# Patient Record
Sex: Female | Born: 1980 | Race: White | Hispanic: No | Marital: Married | Smoking: Former smoker
Health system: Southern US, Community
[De-identification: ages and names within clinical notes are randomized; demographics above are authoritative.]

## PROBLEM LIST (undated history)

## (undated) DIAGNOSIS — E079 Disorder of thyroid, unspecified: Secondary | ICD-10-CM

## (undated) DIAGNOSIS — J329 Chronic sinusitis, unspecified: Secondary | ICD-10-CM

## (undated) DIAGNOSIS — E039 Hypothyroidism, unspecified: Secondary | ICD-10-CM

## (undated) DIAGNOSIS — N86 Erosion and ectropion of cervix uteri: Secondary | ICD-10-CM

## (undated) DIAGNOSIS — Q6589 Other specified congenital deformities of hip: Secondary | ICD-10-CM

## (undated) DIAGNOSIS — J302 Other seasonal allergic rhinitis: Secondary | ICD-10-CM

## (undated) DIAGNOSIS — M1990 Unspecified osteoarthritis, unspecified site: Secondary | ICD-10-CM

## (undated) DIAGNOSIS — Z8489 Family history of other specified conditions: Secondary | ICD-10-CM

## (undated) DIAGNOSIS — F419 Anxiety disorder, unspecified: Secondary | ICD-10-CM

## (undated) HISTORY — DX: Anxiety disorder, unspecified: F41.9

## (undated) HISTORY — DX: Chronic sinusitis, unspecified: J32.9

## (undated) HISTORY — DX: Erosion and ectropion of cervix uteri: N86

## (undated) HISTORY — DX: Disorder of thyroid, unspecified: E07.9

## (undated) HISTORY — DX: Unspecified osteoarthritis, unspecified site: M19.90

## (undated) HISTORY — DX: Other specified congenital deformities of hip: Q65.89

## (undated) HISTORY — PX: NO PAST SURGERIES: SHX2092

## (undated) HISTORY — PX: WISDOM TOOTH EXTRACTION: SHX21

---

## 2001-10-07 ENCOUNTER — Other Ambulatory Visit: Admission: RE | Admit: 2001-10-07 | Discharge: 2001-10-07 | Payer: Self-pay | Admitting: *Deleted

## 2002-11-22 ENCOUNTER — Other Ambulatory Visit: Admission: RE | Admit: 2002-11-22 | Discharge: 2002-11-22 | Payer: Self-pay | Admitting: *Deleted

## 2008-05-27 ENCOUNTER — Inpatient Hospital Stay (HOSPITAL_COMMUNITY): Admission: AD | Admit: 2008-05-27 | Discharge: 2008-05-29 | Payer: Self-pay | Admitting: Obstetrics

## 2010-04-23 LAB — CBC
HCT: 30.7 % — ABNORMAL LOW (ref 36.0–46.0)
Hemoglobin: 10.8 g/dL — ABNORMAL LOW (ref 12.0–15.0)
Hemoglobin: 12.5 g/dL (ref 12.0–15.0)
MCHC: 35.1 g/dL (ref 30.0–36.0)
MCHC: 34.6 g/dL (ref 30.0–36.0)
MCV: 92.8 fL (ref 78.0–100.0)
RBC: 3.29 MIL/uL — ABNORMAL LOW (ref 3.87–5.11)
RBC: 3.88 MIL/uL (ref 3.87–5.11)
RDW: 12.6 % (ref 11.5–15.5)

## 2011-09-05 ENCOUNTER — Other Ambulatory Visit: Payer: Self-pay | Admitting: Internal Medicine

## 2011-09-05 ENCOUNTER — Ambulatory Visit
Admission: RE | Admit: 2011-09-05 | Discharge: 2011-09-05 | Disposition: A | Discharge status: Home / Self Care | Payer: 59 | Source: Ambulatory Visit | Attending: Internal Medicine | Admitting: Internal Medicine

## 2011-09-05 DIAGNOSIS — R1013 Epigastric pain: Secondary | ICD-10-CM

## 2011-09-19 ENCOUNTER — Encounter (INDEPENDENT_AMBULATORY_CARE_PROVIDER_SITE_OTHER): Payer: Self-pay

## 2011-09-30 ENCOUNTER — Encounter (INDEPENDENT_AMBULATORY_CARE_PROVIDER_SITE_OTHER): Payer: Self-pay

## 2011-10-07 ENCOUNTER — Ambulatory Visit (INDEPENDENT_AMBULATORY_CARE_PROVIDER_SITE_OTHER): Payer: Self-pay | Admitting: General Surgery

## 2011-10-09 ENCOUNTER — Encounter (INDEPENDENT_AMBULATORY_CARE_PROVIDER_SITE_OTHER): Payer: Self-pay | Admitting: General Surgery

## 2011-10-09 ENCOUNTER — Ambulatory Visit (INDEPENDENT_AMBULATORY_CARE_PROVIDER_SITE_OTHER): Payer: 59 | Admitting: General Surgery

## 2011-10-09 VITALS — BP 100/60 | HR 84 | Temp 97.2°F | Resp 18 | Ht 62.0 in | Wt 116.4 lb

## 2011-10-09 DIAGNOSIS — K8020 Calculus of gallbladder without cholecystitis without obstruction: Secondary | ICD-10-CM

## 2011-10-09 NOTE — Progress Notes (Signed)
Subjective:     Patient ID: Stacy Williams, female   DOB: January 30, 1980, 31 y.o.   MRN: [id number]  HPI We're asked to see the patient in consultation by Dr. Renne Crigler to evaluate her for gallstones. The patient is a 31 year old white female who has been having episodes of epigastric pain and pressure shortly after eating for the last 10 years. For the most part she has had these episodes about once every month or 2. The episodes will last about 6 hours and then resolved. She denies any nausea or vomiting. The pain usually does radiate into the back. Recently she has been experiencing episodes of pain more frequently. As part of her workup she underwent an ultrasound which did show some small stones in the gallbladder with mild gallbladder wall thickening but no ductal dilatation. She also notes having diarrhea several times a week but this is her normal bowel function.  Review of Systems  Constitutional: Negative.   HENT: Negative.   Eyes: Negative.   Respiratory: Negative.   Cardiovascular: Negative.   Gastrointestinal: Positive for abdominal pain and diarrhea.  Genitourinary: Negative.   Musculoskeletal: Negative.   Skin: Negative.   Neurological: Negative.   Hematological: Negative.   Psychiatric/Behavioral: Negative.        Objective:   Physical Exam  Constitutional: She is oriented to person, place, and time. She appears well-developed and well-nourished.  HENT:  Head: Normocephalic and atraumatic.  Eyes: Conjunctivae normal and EOM are normal. Pupils are equal, round, and reactive to light.  Neck: Normal range of motion. Neck supple.  Cardiovascular: Normal rate, regular rhythm and normal heart sounds.   Pulmonary/Chest: Effort normal and breath sounds normal.  Abdominal: Soft. Bowel sounds are normal. She exhibits no mass. There is no tenderness.  Musculoskeletal: Normal range of motion.  Neurological: She is alert and oriented to person, place, and time.  Skin: Skin is warm and dry.    Psychiatric: She has a normal mood and affect. Her behavior is normal.       Assessment:     The patient has what appears to be symptomatic gallstones. Because of the risk of further painful episodes and possible pancreatitis I think she would benefit from having her gallbladder removed. She would also like to have this done. I have discussed with her in detail the risks and benefits of the operation to remove the gallbladder as well as some of the technical aspects and she understands and wishes to proceed.    Plan:     Plan for laparoscopic cholecystectomy with intraoperative cholangiogram

## 2011-10-09 NOTE — Patient Instructions (Signed)
Plan for lap chole with cholangiogram

## 2011-10-22 ENCOUNTER — Encounter (HOSPITAL_COMMUNITY): Payer: Self-pay | Admitting: Pharmacy Technician

## 2011-10-23 ENCOUNTER — Encounter (HOSPITAL_COMMUNITY)
Admission: RE | Admit: 2011-10-23 | Discharge: 2011-10-23 | Disposition: A | Discharge status: Home / Self Care | Payer: 59 | Source: Ambulatory Visit | Attending: General Surgery | Admitting: General Surgery

## 2011-10-23 ENCOUNTER — Encounter (HOSPITAL_COMMUNITY): Payer: Self-pay

## 2011-10-23 HISTORY — DX: Other seasonal allergic rhinitis: J30.2

## 2011-10-23 HISTORY — DX: Family history of other specified conditions: Z84.89

## 2011-10-23 HISTORY — DX: Hypothyroidism, unspecified: E03.9

## 2011-10-23 LAB — CBC WITH DIFFERENTIAL/PLATELET
Basophils Absolute: 0 10*3/uL (ref 0.0–0.1)
Basophils Relative: 0 % (ref 0–1)
Eosinophils Absolute: 0.1 10*3/uL (ref 0.0–0.7)
HCT: 40.6 % (ref 36.0–46.0)
Hemoglobin: 13.9 g/dL (ref 12.0–15.0)
MCH: 30.5 pg (ref 26.0–34.0)
MCHC: 34.2 g/dL (ref 30.0–36.0)
Monocytes Absolute: 0.6 10*3/uL (ref 0.1–1.0)
Monocytes Relative: 6 % (ref 3–12)
RDW: 12.7 % (ref 11.5–15.5)

## 2011-10-23 LAB — COMPREHENSIVE METABOLIC PANEL
Albumin: 4.5 g/dL (ref 3.5–5.2)
BUN: 12 mg/dL (ref 6–23)
Calcium: 9.9 mg/dL (ref 8.4–10.5)
Creatinine, Ser: 0.63 mg/dL (ref 0.50–1.10)
GFR calc Af Amer: >90 mL/min (ref >90)
GFR calc non Af Amer: >90 mL/min (ref >90)
Total Bilirubin: 0.7 mg/dL (ref 0.3–1.2)
Total Protein: 7.7 g/dL (ref 6.0–8.3)

## 2011-10-23 LAB — HCG, SERUM, QUALITATIVE: Preg, Serum: NEGATIVE

## 2011-10-23 NOTE — Pre-Procedure Instructions (Signed)
20 Stacy Williams  10/23/2011   Your procedure is scheduled on:  Thursday October 17  Report to [facility] at 9:00 AM.  Call this number if you have problems the morning of surgery: [phone]   Remember:   Do not eat or drink:After Midnight.    Take these medicines the morning of surgery with A SIP OF WATER: Levothyroxine (Synthroid), Buspirone (Buspar)   Do not wear jewelry, make-up or nail polish.  Do not wear lotions, powders, or perfumes. You may wear deodorant.  Do not shave 48 hours prior to surgery. Men may shave face and neck.  Do not bring valuables to the hospital.  Contacts, dentures or bridgework may not be worn into surgery.  Leave suitcase in the car. After surgery it may be brought to your room.  For patients admitted to the hospital, checkout time is 11:00 AM the day of discharge.   Patients discharged the day of surgery will not be allowed to drive home.  Name and phone number of your driver: NA  Special Instructions: Shower using CHG 2 nights before surgery and the night before surgery.  If you shower the day of surgery use CHG.  Use special wash - you have one bottle of CHG for all showers.  You should use approximately 1/3 of the bottle for each shower.   Please read over the following fact sheets that you were given: Pain Booklet, Coughing and Deep Breathing and Surgical Site Infection Prevention

## 2011-10-29 MED ORDER — CHLORHEXIDINE GLUCONATE 4 % EX LIQD: 1.0000 "application " | Freq: Once | CUTANEOUS | Status: DC

## 2011-10-29 MED ORDER — CEFAZOLIN SODIUM-DEXTROSE 2-3 GM-% IV SOLR
2.0000 g | INTRAVENOUS | Status: AC
  Administered 2011-10-30: 2 g via INTRAVENOUS
  Filled 2011-10-29: qty 50

## 2011-10-30 ENCOUNTER — Encounter (HOSPITAL_COMMUNITY)
Admission: RE | Disposition: A | Discharge status: Home / Self Care | Payer: Self-pay | Source: Ambulatory Visit | Attending: General Surgery

## 2011-10-30 ENCOUNTER — Ambulatory Visit (HOSPITAL_COMMUNITY): Payer: 59 | Admitting: Anesthesiology

## 2011-10-30 ENCOUNTER — Encounter (HOSPITAL_COMMUNITY): Payer: Self-pay | Admitting: *Deleted

## 2011-10-30 ENCOUNTER — Encounter (HOSPITAL_COMMUNITY): Payer: Self-pay | Admitting: Anesthesiology

## 2011-10-30 ENCOUNTER — Ambulatory Visit (HOSPITAL_COMMUNITY): Payer: 59

## 2011-10-30 ENCOUNTER — Ambulatory Visit (HOSPITAL_COMMUNITY)
Admission: RE | Admit: 2011-10-30 | Discharge: 2011-10-30 | Disposition: A | Discharge status: Home / Self Care | Payer: 59 | Source: Ambulatory Visit | Attending: General Surgery | Admitting: General Surgery

## 2011-10-30 DIAGNOSIS — E039 Hypothyroidism, unspecified: Secondary | ICD-10-CM | POA: Insufficient documentation

## 2011-10-30 DIAGNOSIS — K8010 Calculus of gallbladder with chronic cholecystitis without obstruction: Secondary | ICD-10-CM | POA: Insufficient documentation

## 2011-10-30 DIAGNOSIS — Z01812 Encounter for preprocedural laboratory examination: Secondary | ICD-10-CM | POA: Insufficient documentation

## 2011-10-30 HISTORY — PX: CHOLECYSTECTOMY: SHX55

## 2011-10-30 SURGERY — LAPAROSCOPIC CHOLECYSTECTOMY WITH INTRAOPERATIVE CHOLANGIOGRAM
Anesthesia: General | Site: Abdomen | Wound class: Clean Contaminated

## 2011-10-30 MED ORDER — NEOSTIGMINE METHYLSULFATE 1 MG/ML IJ SOLN
INTRAMUSCULAR | Status: DC | PRN
  Administered 2011-10-30: 3 mg via INTRAVENOUS

## 2011-10-30 MED ORDER — SODIUM CHLORIDE 0.9 % IJ SOLN: 3.0000 mL | Freq: Two times a day (BID) | INTRAMUSCULAR | Status: DC

## 2011-10-30 MED ORDER — KCL IN DEXTROSE-NACL 20-5-0.9 MEQ/L-%-% IV SOLN: INTRAVENOUS | Status: DC

## 2011-10-30 MED ORDER — ONDANSETRON HCL 4 MG/2ML IJ SOLN: 4.0000 mg | Freq: Four times a day (QID) | INTRAMUSCULAR | Status: DC | PRN

## 2011-10-30 MED ORDER — MORPHINE SULFATE 4 MG/ML IJ SOLN
4.0000 mg | INTRAMUSCULAR | Status: DC | PRN
  Administered 2011-10-30: 2 mg via INTRAVENOUS

## 2011-10-30 MED ORDER — FENTANYL CITRATE 0.05 MG/ML IJ SOLN: 50.0000 ug | Freq: Once | INTRAMUSCULAR | Status: DC

## 2011-10-30 MED ORDER — BUSPIRONE HCL 5 MG PO TABS: 5.0000 mg | ORAL_TABLET | Freq: Every day | ORAL | Status: DC

## 2011-10-30 MED ORDER — HYDROCODONE-ACETAMINOPHEN 5-325 MG PO TABS: 1.0000 | ORAL_TABLET | Freq: Four times a day (QID) | ORAL | Status: DC | PRN

## 2011-10-30 MED ORDER — ARTIFICIAL TEARS OP OINT
TOPICAL_OINTMENT | OPHTHALMIC | Status: DC | PRN
  Administered 2011-10-30: 1 via OPHTHALMIC

## 2011-10-30 MED ORDER — LACTATED RINGERS IV SOLN
INTRAVENOUS | Status: DC
  Administered 2011-10-30: 10:00:00 via INTRAVENOUS

## 2011-10-30 MED ORDER — SODIUM CHLORIDE 0.9 % IJ SOLN: 3.0000 mL | INTRAMUSCULAR | Status: DC | PRN

## 2011-10-30 MED ORDER — FENTANYL CITRATE 0.05 MG/ML IJ SOLN
INTRAMUSCULAR | Status: DC | PRN
  Administered 2011-10-30: 50 ug via INTRAVENOUS
  Administered 2011-10-30: 100 ug via INTRAVENOUS

## 2011-10-30 MED ORDER — 0.9 % SODIUM CHLORIDE (POUR BTL) OPTIME
TOPICAL | Status: DC | PRN
  Administered 2011-10-30: 1000 mL

## 2011-10-30 MED ORDER — LORATADINE 10 MG PO TABS: 10.0000 mg | ORAL_TABLET | Freq: Every day | ORAL | Status: DC

## 2011-10-30 MED ORDER — SODIUM CHLORIDE 0.9 % IV SOLN
INTRAVENOUS | Status: DC | PRN
  Administered 2011-10-30: 11:00:00

## 2011-10-30 MED ORDER — ONDANSETRON HCL 4 MG/2ML IJ SOLN
INTRAMUSCULAR | Status: DC | PRN
  Administered 2011-10-30: 4 mg via INTRAVENOUS

## 2011-10-30 MED ORDER — BUPIVACAINE-EPINEPHRINE 0.25% -1:200000 IJ SOLN
INTRAMUSCULAR | Status: DC | PRN
  Administered 2011-10-30: 5 mL

## 2011-10-30 MED ORDER — SODIUM CHLORIDE 0.9 % IV SOLN: 250.0000 mL | INTRAVENOUS | Status: DC | PRN

## 2011-10-30 MED ORDER — ACETAMINOPHEN 650 MG RE SUPP: 650.0000 mg | RECTAL | Status: DC | PRN

## 2011-10-30 MED ORDER — TRAMADOL HCL 50 MG PO TABS: 50.0000 mg | ORAL_TABLET | Freq: Four times a day (QID) | ORAL | Status: DC | PRN

## 2011-10-30 MED ORDER — DEXAMETHASONE SODIUM PHOSPHATE 4 MG/ML IJ SOLN
INTRAMUSCULAR | Status: DC | PRN
  Administered 2011-10-30: 10 mg via INTRAVENOUS

## 2011-10-30 MED ORDER — GLYCOPYRROLATE 0.2 MG/ML IJ SOLN
INTRAMUSCULAR | Status: DC | PRN
  Administered 2011-10-30: 0.4 mg via INTRAVENOUS

## 2011-10-30 MED ORDER — MIDAZOLAM HCL 5 MG/5ML IJ SOLN
INTRAMUSCULAR | Status: DC | PRN
  Administered 2011-10-30: 2 mg via INTRAVENOUS

## 2011-10-30 MED ORDER — EPHEDRINE SULFATE 50 MG/ML IJ SOLN
INTRAMUSCULAR | Status: DC | PRN
  Administered 2011-10-30 (×2): 10 mg via INTRAVENOUS

## 2011-10-30 MED ORDER — ACETAMINOPHEN 10 MG/ML IV SOLN
INTRAVENOUS | Status: AC
  Filled 2011-10-30: qty 100

## 2011-10-30 MED ORDER — MORPHINE SULFATE 4 MG/ML IJ SOLN
INTRAMUSCULAR | Status: AC
  Filled 2011-10-30: qty 1

## 2011-10-30 MED ORDER — SODIUM CHLORIDE 0.9 % IR SOLN
Status: DC | PRN
  Administered 2011-10-30: 1000 mL

## 2011-10-30 MED ORDER — BUPIVACAINE-EPINEPHRINE PF 0.25-1:200000 % IJ SOLN
INTRAMUSCULAR | Status: AC
  Filled 2011-10-30: qty 30

## 2011-10-30 MED ORDER — OXYCODONE HCL 5 MG PO TABS: 5.0000 mg | ORAL_TABLET | ORAL | Status: DC | PRN

## 2011-10-30 MED ORDER — LEVOTHYROXINE SODIUM 88 MCG PO TABS: 88.0000 ug | ORAL_TABLET | Freq: Every day | ORAL | Status: DC

## 2011-10-30 MED ORDER — ACETAMINOPHEN 325 MG PO TABS: 650.0000 mg | ORAL_TABLET | ORAL | Status: DC | PRN

## 2011-10-30 MED ORDER — ROCURONIUM BROMIDE 100 MG/10ML IV SOLN
INTRAVENOUS | Status: DC | PRN
  Administered 2011-10-30: 40 mg via INTRAVENOUS

## 2011-10-30 MED ORDER — PRENATAL 27-0.8 MG PO TABS: 1.0000 | ORAL_TABLET | Freq: Every day | ORAL | Status: DC

## 2011-10-30 MED ORDER — MIDAZOLAM HCL 2 MG/2ML IJ SOLN: 1.0000 mg | INTRAMUSCULAR | Status: DC | PRN

## 2011-10-30 MED ORDER — LIDOCAINE HCL (CARDIAC) 20 MG/ML IV SOLN
INTRAVENOUS | Status: DC | PRN
  Administered 2011-10-30: 60 mg via INTRAVENOUS

## 2011-10-30 MED ORDER — PROPOFOL 10 MG/ML IV BOLUS
INTRAVENOUS | Status: DC | PRN
  Administered 2011-10-30: 150 mg via INTRAVENOUS

## 2011-10-30 MED ORDER — ACETAMINOPHEN 10 MG/ML IV SOLN
1000.0000 mg | Freq: Once | INTRAVENOUS | Status: AC
  Administered 2011-10-30: 1000 mg via INTRAVENOUS
  Filled 2011-10-30: qty 100

## 2011-10-30 SURGICAL SUPPLY — 43 items
APPLIER CLIP 5 13 M/L LIGAMAX5 (MISCELLANEOUS) ×2
APPLIER CLIP ROT 10 11.4 M/L (STAPLE) ×2
BLADE SURG ROTATE 9660 (MISCELLANEOUS) IMPLANT
CANISTER SUCTION 2500CC (MISCELLANEOUS) ×2 IMPLANT
CATH REDDICK CHOLANGI 4FR 50CM (CATHETERS) ×2 IMPLANT
CHLORAPREP W/TINT 26ML (MISCELLANEOUS) ×2 IMPLANT
CLIP APPLIE 5 13 M/L LIGAMAX5 (MISCELLANEOUS) ×1 IMPLANT
CLIP APPLIE ROT 10 11.4 M/L (STAPLE) ×1 IMPLANT
CLOTH BEACON ORANGE TIMEOUT ST (SAFETY) ×2 IMPLANT
COVER MAYO STAND STRL (DRAPES) ×2 IMPLANT
COVER SURGICAL LIGHT HANDLE (MISCELLANEOUS) ×2 IMPLANT
DECANTER SPIKE VIAL GLASS SM (MISCELLANEOUS) ×2 IMPLANT
DERMABOND ADVANCED (GAUZE/BANDAGES/DRESSINGS) ×1
DERMABOND ADVANCED .7 DNX12 (GAUZE/BANDAGES/DRESSINGS) ×1 IMPLANT
DRAPE C-ARM 42X72 X-RAY (DRAPES) ×2 IMPLANT
DRAPE UTILITY 15X26 W/TAPE STR (DRAPE) ×4 IMPLANT
ELECT REM PT RETURN 9FT ADLT (ELECTROSURGICAL) ×2
ELECTRODE REM PT RTRN 9FT ADLT (ELECTROSURGICAL) ×1 IMPLANT
GLOVE BIO SURGEON STRL SZ7.5 (GLOVE) ×4 IMPLANT
GLOVE BIOGEL PI IND STRL 7.0 (GLOVE) ×1 IMPLANT
GLOVE BIOGEL PI IND STRL 7.5 (GLOVE) ×2 IMPLANT
GLOVE BIOGEL PI INDICATOR 7.0 (GLOVE) ×1
GLOVE BIOGEL PI INDICATOR 7.5 (GLOVE) ×2
GLOVE EUDERMIC 7 POWDERFREE (GLOVE) ×2 IMPLANT
GLOVE SURG SS PI 7.0 STRL IVOR (GLOVE) ×2 IMPLANT
GOWN STRL NON-REIN LRG LVL3 (GOWN DISPOSABLE) ×6 IMPLANT
IV CATH 14GX2 1/4 (CATHETERS) ×2 IMPLANT
KIT BASIN OR (CUSTOM PROCEDURE TRAY) ×2 IMPLANT
KIT ROOM TURNOVER OR (KITS) ×2 IMPLANT
NS IRRIG 1000ML POUR BTL (IV SOLUTION) ×2 IMPLANT
PAD ARMBOARD 7.5X6 YLW CONV (MISCELLANEOUS) ×2 IMPLANT
POUCH SPECIMEN RETRIEVAL 10MM (ENDOMECHANICALS) ×2 IMPLANT
SCISSORS LAP 5X35 DISP (ENDOMECHANICALS) IMPLANT
SET IRRIG TUBING LAPAROSCOPIC (IRRIGATION / IRRIGATOR) ×2 IMPLANT
SLEEVE ENDOPATH XCEL 5M (ENDOMECHANICALS) ×4 IMPLANT
SPECIMEN JAR SMALL (MISCELLANEOUS) ×2 IMPLANT
SUT MNCRL AB 4-0 PS2 18 (SUTURE) ×2 IMPLANT
TOWEL OR 17X24 6PK STRL BLUE (TOWEL DISPOSABLE) ×2 IMPLANT
TOWEL OR 17X26 10 PK STRL BLUE (TOWEL DISPOSABLE) ×2 IMPLANT
TRAY LAPAROSCOPIC (CUSTOM PROCEDURE TRAY) ×2 IMPLANT
TROCAR XCEL BLUNT TIP 100MML (ENDOMECHANICALS) ×2 IMPLANT
TROCAR XCEL NON-BLD 11X100MML (ENDOMECHANICALS) ×2 IMPLANT
TROCAR XCEL NON-BLD 5MMX100MML (ENDOMECHANICALS) ×2 IMPLANT

## 2011-10-30 NOTE — Anesthesia Preprocedure Evaluation (Addendum)
Anesthesia Evaluation  Patient identified by MRN, date of birth, ID band Patient awake    Reviewed: Allergy & Precautions, H&P , NPO status , Patient's Chart, lab work & pertinent test results  Airway Mallampati: I TM Distance: >3 FB Neck ROM: Full    Dental  (+) Teeth Intact and Dental Advisory Given   Pulmonary former smoker,  sinusitis breath sounds clear to auscultation        Cardiovascular Rhythm:Regular Rate:Normal     Neuro/Psych Anxiety    GI/Hepatic   Endo/Other  Hypothyroidism   Renal/GU      Musculoskeletal   Abdominal   Peds  Hematology   Anesthesia Other Findings   Reproductive/Obstetrics                          Anesthesia Physical Anesthesia Plan  ASA: II  Anesthesia Plan: General   Post-op Pain Management:    Induction: Intravenous  Airway Management Planned: Oral ETT  Additional Equipment:   Intra-op Plan:   Post-operative Plan: Extubation in OR  Informed Consent: I have reviewed the patients History and Physical, chart, labs and discussed the procedure including the risks, benefits and alternatives for the proposed anesthesia with the patient or authorized representative who has indicated his/her understanding and acceptance.     Plan Discussed with: CRNA and Surgeon  Anesthesia Plan Comments:         Anesthesia Quick Evaluation

## 2011-10-30 NOTE — Anesthesia Postprocedure Evaluation (Signed)
  Anesthesia Post-op Note  Patient: Stacy Williams  Procedure(s) Performed: Procedure(s) (LRB) with comments: LAPAROSCOPIC CHOLECYSTECTOMY WITH INTRAOPERATIVE CHOLANGIOGRAM (N/A)  Patient Location: PACU  Anesthesia Type: General  Level of Consciousness: awake  Airway and Oxygen Therapy: Patient Spontanous Breathing  Post-op Pain: mild  Post-op Assessment: Post-op Vital signs reviewed, Patient's Cardiovascular Status Stable, Respiratory Function Stable, Patent Airway, No signs of Nausea or vomiting and Pain level controlled  Post-op Vital Signs: stable  Complications: No apparent anesthesia complications

## 2011-10-30 NOTE — Interval H&P Note (Signed)
History and Physical Interval Note:  10/30/2011 9:24 AM  Stacy Williams  has presented today for surgery, with the diagnosis of gallstones  The various methods of treatment have been discussed with the patient and family. After consideration of risks, benefits and other options for treatment, the patient has consented to  Procedure(s) (LRB) with comments: LAPAROSCOPIC CHOLECYSTECTOMY WITH INTRAOPERATIVE CHOLANGIOGRAM (N/A) as a surgical intervention .  The patient's history has been reviewed, patient examined, no change in status, stable for surgery.  I have reviewed the patient's chart and labs.  Questions were answered to the patient's satisfaction.     TOTH III,Yanni Ruberg S

## 2011-10-30 NOTE — Anesthesia Procedure Notes (Signed)
Procedure Name: Intubation Date/Time: 10/23/2011 10:15 AM Performed by: Jefm Miles E Pre-anesthesia Checklist: Patient identified, Timeout performed, Emergency Drugs available, Suction available and Patient being monitored Patient Re-evaluated:Patient Re-evaluated prior to inductionOxygen Delivery Method: Circle system utilized Preoxygenation: Pre-oxygenation with 100% oxygen Intubation Type: IV induction Ventilation: Mask ventilation without difficulty Laryngoscope Size: Mac and 3 Grade View: Grade I Tube type: Oral Tube size: 7.0 mm Number of attempts: 1 Airway Equipment and Method: Stylet Placement Confirmation: ETT inserted through vocal cords under direct vision,  breath sounds checked- equal and bilateral and positive ETCO2 Secured at: 21 cm Tube secured with: Tape Dental Injury: Teeth and Oropharynx as per pre-operative assessment

## 2011-10-30 NOTE — Transfer of Care (Signed)
Immediate Anesthesia Transfer of Care Note  Patient: Stacy Williams  Procedure(s) Performed: Procedure(s) (LRB) with comments: LAPAROSCOPIC CHOLECYSTECTOMY WITH INTRAOPERATIVE CHOLANGIOGRAM (N/A)  Patient Location: PACU  Anesthesia Type: General  Level of Consciousness: awake, alert  and oriented  Airway & Oxygen Therapy: Patient Spontanous Breathing and Patient connected to nasal cannula oxygen  Post-op Assessment: Report given to PACU RN  Post vital signs: Reviewed and stable  Complications: No apparent anesthesia complications

## 2011-10-30 NOTE — H&P (View-Only) (Signed)
Subjective:     Patient ID: Stacy Williams, female   DOB: 06/23/1980, 31 y.o.   MRN: [id number]  HPI We're asked to see the patient in consultation by Dr. Pharr to evaluate her for gallstones. The patient is a 31-year-old white female who has been having episodes of epigastric pain and pressure shortly after eating for the last 10 years. For the most part she has had these episodes about once every month or 2. The episodes will last about 6 hours and then resolved. She denies any nausea or vomiting. The pain usually does radiate into the back. Recently she has been experiencing episodes of pain more frequently. As part of her workup she underwent an ultrasound which did show some small stones in the gallbladder with mild gallbladder wall thickening but no ductal dilatation. She also notes having diarrhea several times a week but this is her normal bowel function.  Review of Systems  Constitutional: Negative.   HENT: Negative.   Eyes: Negative.   Respiratory: Negative.   Cardiovascular: Negative.   Gastrointestinal: Positive for abdominal pain and diarrhea.  Genitourinary: Negative.   Musculoskeletal: Negative.   Skin: Negative.   Neurological: Negative.   Hematological: Negative.   Psychiatric/Behavioral: Negative.        Objective:   Physical Exam  Constitutional: She is oriented to person, place, and time. She appears well-developed and well-nourished.  HENT:  Head: Normocephalic and atraumatic.  Eyes: Conjunctivae normal and EOM are normal. Pupils are equal, round, and reactive to light.  Neck: Normal range of motion. Neck supple.  Cardiovascular: Normal rate, regular rhythm and normal heart sounds.   Pulmonary/Chest: Effort normal and breath sounds normal.  Abdominal: Soft. Bowel sounds are normal. She exhibits no mass. There is no tenderness.  Musculoskeletal: Normal range of motion.  Neurological: She is alert and oriented to person, place, and time.  Skin: Skin is warm and dry.    Psychiatric: She has a normal mood and affect. Her behavior is normal.       Assessment:     The patient has what appears to be symptomatic gallstones. Because of the risk of further painful episodes and possible pancreatitis I think she would benefit from having her gallbladder removed. She would also like to have this done. I have discussed with her in detail the risks and benefits of the operation to remove the gallbladder as well as some of the technical aspects and she understands and wishes to proceed.    Plan:     Plan for laparoscopic cholecystectomy with intraoperative cholangiogram      

## 2011-10-30 NOTE — Preoperative (Signed)
Beta Blockers   Reason not to administer Beta Blockers:Not Applicable 

## 2011-10-30 NOTE — Progress Notes (Signed)
Pt d/c'd home via wheelchair, parents at bedside

## 2011-10-30 NOTE — Op Note (Signed)
10/30/2011  11:09 AM  PATIENT:  Stacy Williams  31 y.o. female  PRE-OPERATIVE DIAGNOSIS:  CHOLELITHIASIS  POST-OPERATIVE DIAGNOSIS:  CHOLELITHIASIS  PROCEDURE:  Procedure(s) (LRB) with comments: LAPAROSCOPIC CHOLECYSTECTOMY WITH INTRAOPERATIVE CHOLANGIOGRAM (N/A)  SURGEON:  Surgeon(s) and Role:    * Robyne Askew, MD - Primary    * Christian Leta Jungling, MD - Assisting  PHYSICIAN ASSISTANT:   ASSISTANTS: Dr. Jamey Ripa   ANESTHESIA:   general  EBL:     BLOOD ADMINISTERED:none  DRAINS: none   LOCAL MEDICATIONS USED:  MARCAINE     SPECIMEN:  Source of Specimen:  gallbladder  DISPOSITION OF SPECIMEN:  PATHOLOGY  COUNTS:  YES  TOURNIQUET:  * No tourniquets in log *  DICTATION: .Dragon Dictation  Procedure: After informed consent was obtained the patient was brought to the operating room and placed in the supine position on the operating room table. After adequate induction of general anesthesia the patient's abdomen was prepped with ChloraPrep allowed to dry and draped in usual sterile manner. The area below the umbilicus was infiltrated with quarter percent  Marcaine. A small incision was made with a 15 blade knife. The incision was carried down through the subcutaneous tissue bluntly with a hemostat and [employer] retractors. The linea alba was identified. The linea alba was incised with a 15 blade knife and each side was grasped with Coker clamps. The preperitoneal space was then probed with a hemostat until the peritoneum was opened and access was gained to the abdominal cavity. A 0 Vicryl pursestring stitch was placed in the fascia surrounding the opening. A Hassan cannula was then placed through the opening and anchored in place with the previously placed Vicryl purse string stitch. The abdomen was insufflated with carbon dioxide without difficulty. A laparoscope was inserted through the [facility] cannula in the right upper quadrant was inspected. Next the epigastric region was  infiltrated with % Marcaine. A small incision was made with a 15 blade knife. A 5 mm port was placed bluntly through this incision into the abdominal cavity under direct vision. Next 2 sites were chosen laterally on the right side of the abdomen for placement of 5 mm ports. Each of these areas was infiltrated with quarter percent Marcaine. Small stab incisions were made with a 15 blade knife. 5 mm ports were then placed bluntly through these incisions into the abdominal cavity under direct vision without difficulty. A blunt grasper was placed through the lateralmost 5 mm port and used to grasp the dome of the gallbladder and elevated anteriorly and superiorly. Another blunt grasper was placed through the other 5 mm port and used to retract the body and neck of the gallbladder. A dissector was placed through the epigastric port and using the electrocautery the peritoneal reflection at the gallbladder neck was opened. Blunt dissection was then carried out in this area until the gallbladder neck-cystic duct junction was readily identified and a good window was created. A single clip was placed on the gallbladder neck. A small  ductotomy was made just below the clip with laparoscopic scissors. A 14-gauge Angiocath was then placed through the anterior abdominal wall under direct vision. A Reddick cholangiogram catheter was then placed through the Angiocath and flushed. The catheter was then placed in the cystic duct and anchored in place with a clip. A cholangiogram was obtained that showed no filling defects good emptying into the duodenum an adequate length on the cystic duct. The anchoring clip and catheters were then removed from the  patient. 3 clips were placed proximally on the cystic duct and the duct was divided between the 2 sets of clips. Posterior to this the cystic artery was identified and again dissected bluntly in a circumferential manner until a good window  was created. 2 clips were placed proximally  and one distally on the artery and the artery was divided between the 2 sets of clips. Next a laparoscopic hook cautery device was used to separate the gallbladder from the liver bed. Prior to completely detaching the gallbladder from the liver bed the liver bed was inspected and several small bleeding points were coagulated with the electrocautery until the area was completely hemostatic. The gallbladder was then detached the rest of it from the liver bed without difficulty. A laparoscopic bag was inserted through the [facility] canula. The gallbladder was placed within the bag and the bag was sealed. The laparoscope was moved to the epigastric port. The gallbladder was removed through the [facility] cannula without difficulty. The fascial defect was then closed with the previously placed Vicryl pursestring stitch as well as with another figure-of-eight 0 Vicryl stitch. The liver bed was inspected again and found to be hemostatic. The abdomen was irrigated with copious amounts of saline until the effluent was clear. The ports were then removed under direct vision without difficulty and were found to be hemostatic. The gas was allowed to escape. The skin incisions were all closed with interrupted 4-0 Monocryl subcuticular stitches. Dermabond dressings were applied. The patient tolerated the procedure well. At the end of the case all needle sponge and instrument counts were correct. The patient was then awakened and taken to recovery in stable condition   PLAN OF CARE: Discharge to home after PACU  PATIENT DISPOSITION:  PACU - hemodynamically stable.   Delay start of Pharmacological VTE agent (>24hrs) due to surgical blood loss or risk of bleeding: not applicable

## 2011-10-31 ENCOUNTER — Encounter (HOSPITAL_COMMUNITY): Payer: Self-pay | Admitting: General Surgery

## 2011-10-31 ENCOUNTER — Telehealth (INDEPENDENT_AMBULATORY_CARE_PROVIDER_SITE_OTHER): Payer: Self-pay

## 2011-10-31 NOTE — Telephone Encounter (Signed)
Patient called to ask if she could use something to treat her surgical scars to reduce their size and when she can start using them.  I told her to wait about 3 to 4 weeks postop.  If she is healed she can start using Mederma, Cocoa butter cream or vitamin e cream.  She is coming in for a follow up 3 weeks postop.  I told her Dr Carolynne Edouard will check her incisions and let her know when to start.

## 2011-11-14 ENCOUNTER — Encounter (INDEPENDENT_AMBULATORY_CARE_PROVIDER_SITE_OTHER): Payer: Self-pay | Admitting: General Surgery

## 2011-11-14 ENCOUNTER — Ambulatory Visit (INDEPENDENT_AMBULATORY_CARE_PROVIDER_SITE_OTHER): Payer: 59 | Admitting: General Surgery

## 2011-11-14 ENCOUNTER — Other Ambulatory Visit (INDEPENDENT_AMBULATORY_CARE_PROVIDER_SITE_OTHER): Payer: Self-pay | Admitting: General Surgery

## 2011-11-14 ENCOUNTER — Telehealth (INDEPENDENT_AMBULATORY_CARE_PROVIDER_SITE_OTHER): Payer: Self-pay | Admitting: General Surgery

## 2011-11-14 VITALS — BP 110/58 | HR 96 | Temp 98.6°F | Resp 20 | Ht 62.0 in | Wt 115.8 lb

## 2011-11-14 DIAGNOSIS — Z09 Encounter for follow-up examination after completed treatment for conditions other than malignant neoplasm: Secondary | ICD-10-CM

## 2011-11-14 DIAGNOSIS — R0789 Other chest pain: Secondary | ICD-10-CM

## 2011-11-14 LAB — CBC WITH DIFFERENTIAL/PLATELET
Basophils Absolute: 0.1 10*3/uL (ref 0.0–0.1)
Eosinophils Relative: 6 % — ABNORMAL HIGH (ref 0–5)
Lymphocytes Relative: 32 % (ref 12–46)
MCV: 87.2 fL (ref 78.0–100.0)
Neutro Abs: 3.8 10*3/uL (ref 1.7–7.7)
Neutrophils Relative %: 54 % (ref 43–77)
Platelets: 362 10*3/uL (ref 150–400)
RDW: 12.6 % (ref 11.5–15.5)
WBC: 7 10*3/uL (ref 4.0–10.5)

## 2011-11-14 LAB — COMPREHENSIVE METABOLIC PANEL
ALT: 8 U/L (ref 0–35)
AST: 17 U/L (ref 0–37)
Calcium: 10 mg/dL (ref 8.4–10.5)
Chloride: 106 mEq/L (ref 96–112)
Creat: 0.73 mg/dL (ref 0.50–1.10)
Sodium: 140 mEq/L (ref 135–145)

## 2011-11-14 MED ORDER — IBUPROFEN 800 MG PO TABS: 800.0000 mg | ORAL_TABLET | Freq: Four times a day (QID) | ORAL | Status: DC | PRN

## 2011-11-14 NOTE — Patient Instructions (Signed)
Pick up prescription at pharmacy Keep your appointment with Dr Carolynne Edouard Call if symptoms worsen

## 2011-11-14 NOTE — Telephone Encounter (Signed)
Pt calling to report 3 days of a sharp, stabbing pain under her Lt breast, which has now become constant.  The pain worsens with taking a deep breath, and is causing some shortness of breath (since today.)  She denies fever and is calling from her work.  Paged and updated Dr. Andrey Campanile (urgent clinic today, as Dr. Carolynne Edouard is unavailable.)  Received orders for labs:  CBC, CMET and Lipase.  Scheduled pt into Urgent Clinic today.

## 2011-11-14 NOTE — Progress Notes (Signed)
Subjective:     Patient ID: Stacy Williams, female   DOB: Mar 19, 1980, 31 y.o.   MRN: [id number]  HPI 31 year old Caucasian female comes in complaining of pain underneath her left breast for the past 3 days after undergoing laparoscopic cholecystectomy with cholangiogram on October 17 by Dr. Carolynne Edouard. She states about 3 days ago she started to have some discomfort underneath her left breast. It initially was intermittent. She states that she occasionally will have sternal chest pain related to her underlying anxiety. However over the past day and a half the episodes have become more frequent and more painful. She states that she thought it was a little bit worse with deep inspiration. She denies any fever, chills, sputum production, cough. She denies any shortness of breath at rest. She denies any calf swelling. She denies any nausea, vomiting, diarrhea or constipation.  Review of Systems     Objective:   Physical Exam BP 110/58  Pulse 96  Temp 98.6 F (37 C) (Temporal)  Resp 20  Ht 5\' 2"  (1.575 m)  Wt 115 lb 12.8 oz (52.527 kg)  BMI 21.18 kg/m2  LMP 10/18/2011  Gen: alert, NAD, non-toxic appearing Pupils: equal, no scleral icterus Pulm: Lungs clear to auscultation, symmetric chest rise CV: regular rate and rhythm Abd: soft, nontender, nondistended. Well-healed trocar sites. No cellulitis. No incisional hernia Ext: no edema, no calf tenderness Skin: no rash, no jaundice     Assessment:     Status post laparoscopic cholecystectomy with interoperative cholangiogram on October 17 Left lower chest wall discomfort most likely musculoskeletal    Plan:     Clinically the patient looks excellent. Her vital signs are stable. We checked her CBC,CMET, and lipase earlier today all of which were normal. Her lungs sound clear. My suspicion for an acute process is low. This seems more musculoskeletal. I gave her a prescription of Motrin 800 mg every 8 hours when necessary for pain for several days. He  she was instructed to keep her followup appointment with Dr. Carolynne Edouard. She was instructed to call if her symptoms worsen.  Mary Sella. Andrey Campanile, MD, FACS General, Bariatric, & Minimally Invasive Surgery [facility], [place]

## 2011-11-20 ENCOUNTER — Encounter (INDEPENDENT_AMBULATORY_CARE_PROVIDER_SITE_OTHER): Payer: Self-pay | Admitting: General Surgery

## 2011-11-20 ENCOUNTER — Ambulatory Visit (INDEPENDENT_AMBULATORY_CARE_PROVIDER_SITE_OTHER): Payer: 59 | Admitting: General Surgery

## 2011-11-20 VITALS — BP 112/62 | HR 76 | Temp 97.6°F | Resp 12 | Ht 62.0 in | Wt 116.2 lb

## 2011-11-20 DIAGNOSIS — K8010 Calculus of gallbladder with chronic cholecystitis without obstruction: Secondary | ICD-10-CM

## 2011-11-20 NOTE — Patient Instructions (Signed)
Avoid heavy lifting for another 2-3 weeks

## 2011-12-03 NOTE — Progress Notes (Signed)
Subjective:     Patient ID: Stacy Williams, female   DOB: 1980/11/24, 31 y.o.   MRN: [id number]  HPI The patient is a 31 year old white female who is about 3 weeks status post laparoscopic cholecystectomy for gallstones. She is feeling well and has no complaints today. Her appetite is good her bowels are working normally. She definitely feels better than she did prior to surgery.  Review of Systems     Objective:   Physical Exam On exam her abdomen is soft and nontender. Her incisions are healing nicely with no sign of infection.    Assessment:     3 weeks status post laparoscopic cholecystectomy    Plan:     At this point I think she can return to her normal activities. I would try to refrain from heavy lifting for a couple more weeks. Otherwise she can return to see [place] on a when necessary basis.

## 2012-08-12 LAB — OB RESULTS CONSOLE ANTIBODY SCREEN: ANTIBODY SCREEN: NEGATIVE

## 2012-08-12 LAB — OB RESULTS CONSOLE HEPATITIS B SURFACE ANTIGEN: HEP B S AG: NEGATIVE

## 2012-08-12 LAB — OB RESULTS CONSOLE RUBELLA ANTIBODY, IGM: Rubella: IMMUNE

## 2012-08-12 LAB — OB RESULTS CONSOLE RPR: RPR: NONREACTIVE

## 2012-08-12 LAB — OB RESULTS CONSOLE ABO/RH: RH Type: POSITIVE

## 2012-08-12 LAB — OB RESULTS CONSOLE HIV ANTIBODY (ROUTINE TESTING): HIV: NONREACTIVE

## 2012-08-19 LAB — OB RESULTS CONSOLE GC/CHLAMYDIA
Chlamydia: NEGATIVE
GC PROBE AMP, GENITAL: NEGATIVE

## 2012-12-31 IMAGING — US US ABDOMEN LIMITED
1 series · 14 of 25 positions shown · non-contrast
Comparison: None.

CLINICAL DATA: Abdominal pain which increases after eating

LIMITED ABDOMINAL ULTRASOUND - RIGHT UPPER QUADRANT

[Series 1: us abdomen limited · 0.24mm/px · 14 of 55 slices shown]
[im 1/55]
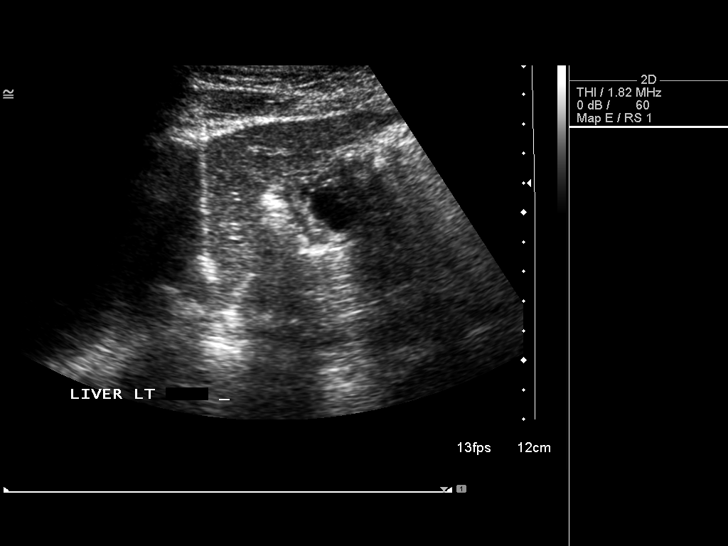
[im 5/55]
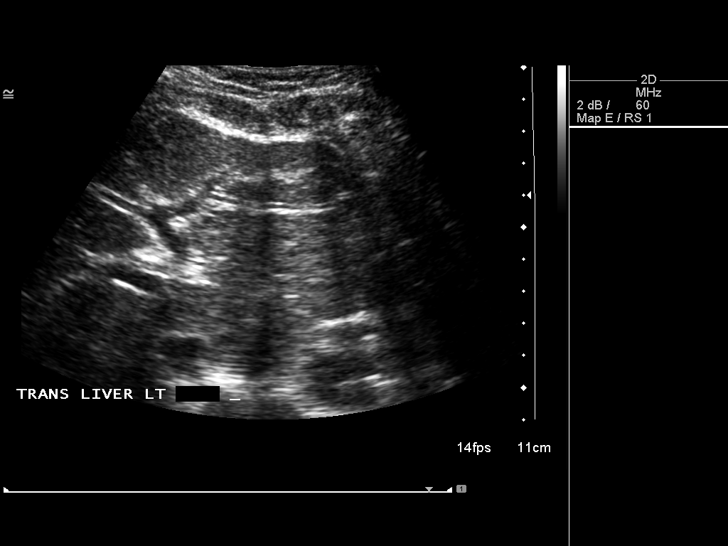
[im 10/55]
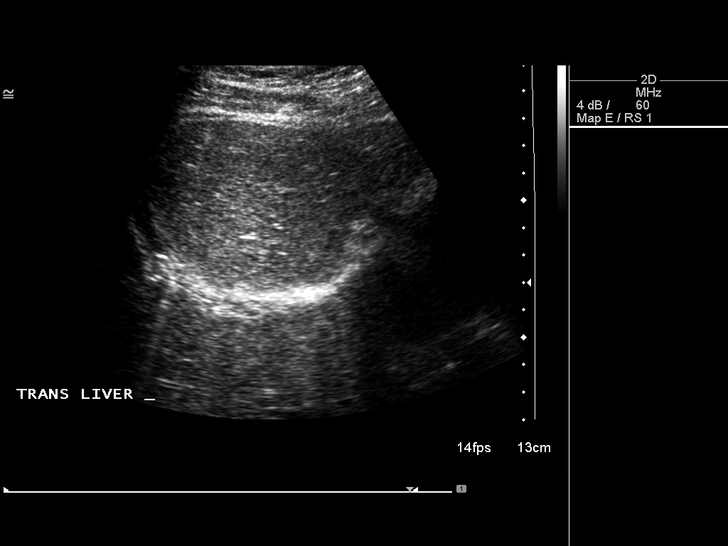
[im 14/55]
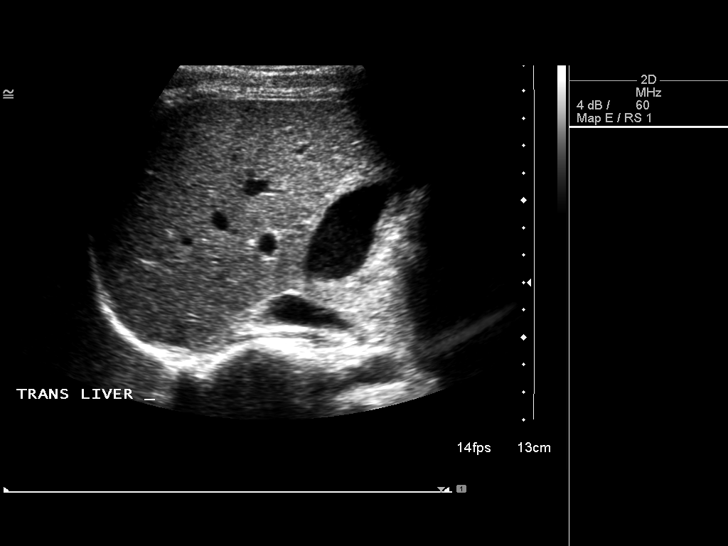
[im 19/55]
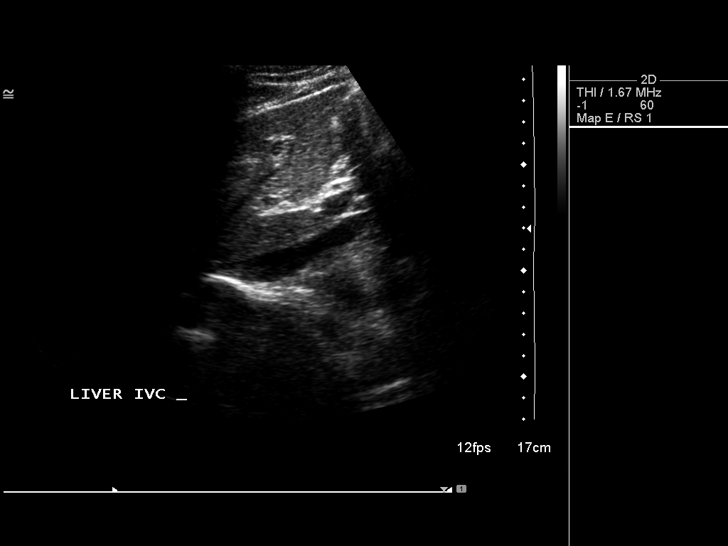
[im 21/55]
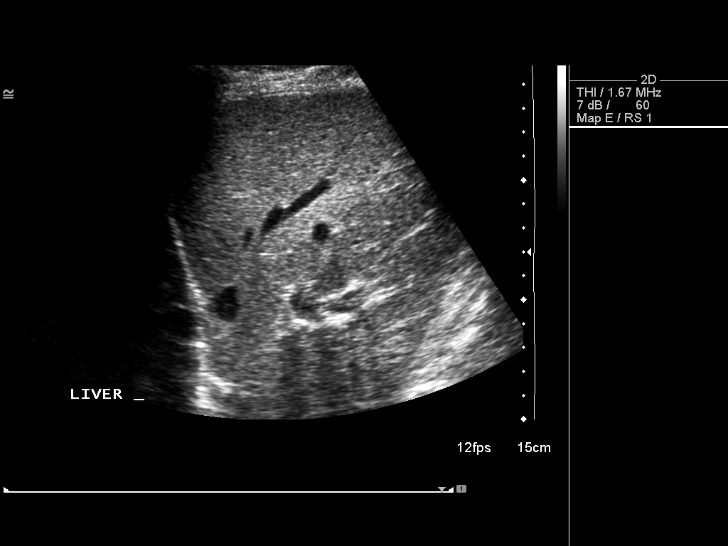
[im 25/55]
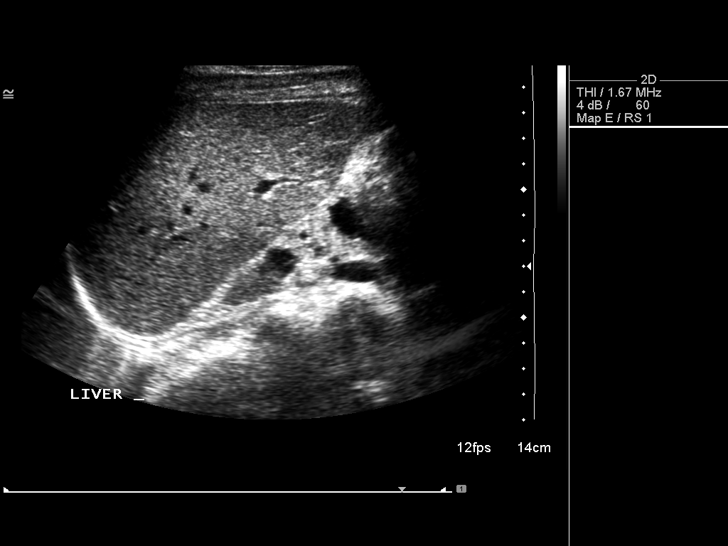
[im 30/55]
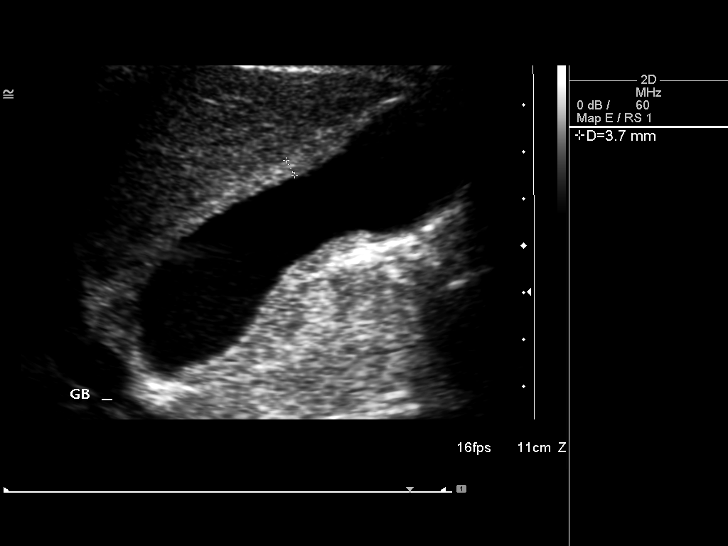
[im 34/55]
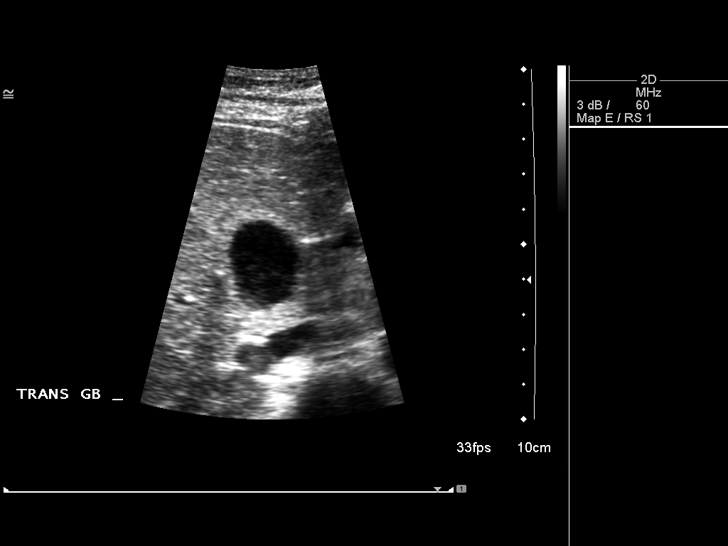
[im 37/55]
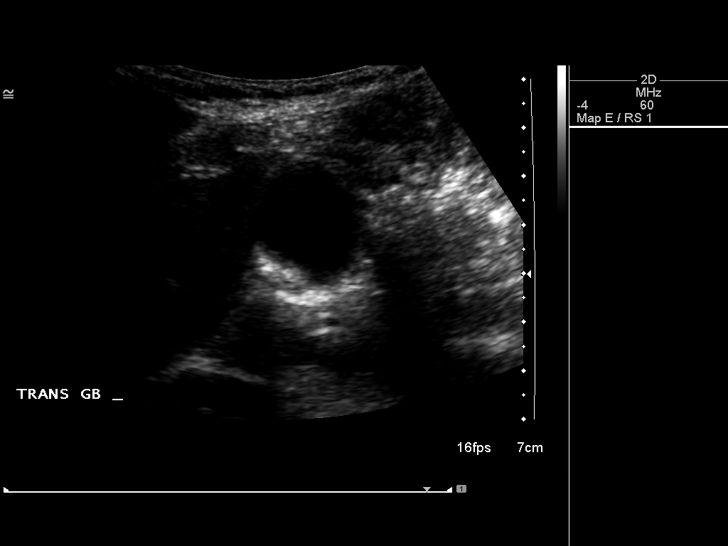
[im 41/55]
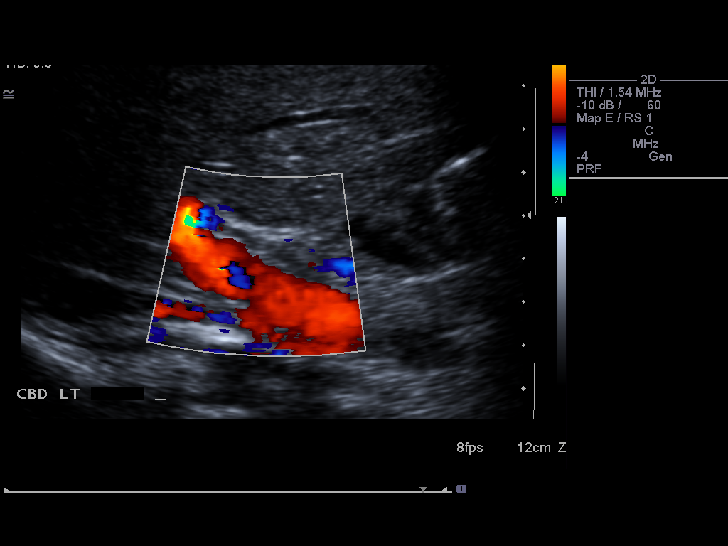
[im 46/55]
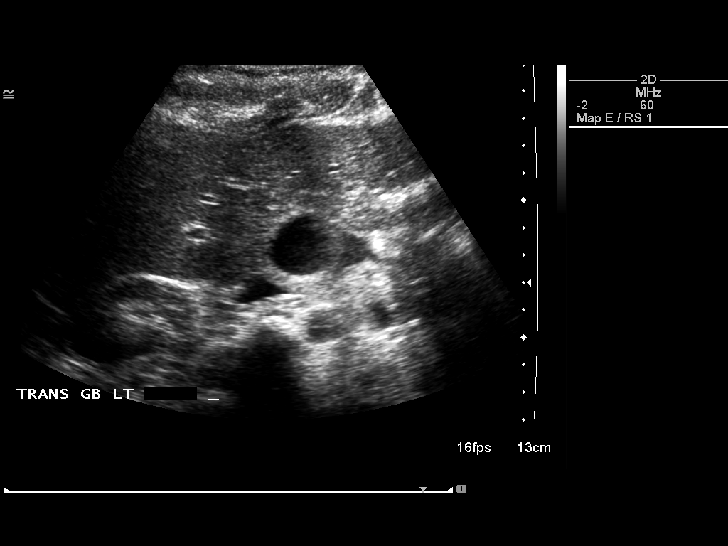
[im 50/55]
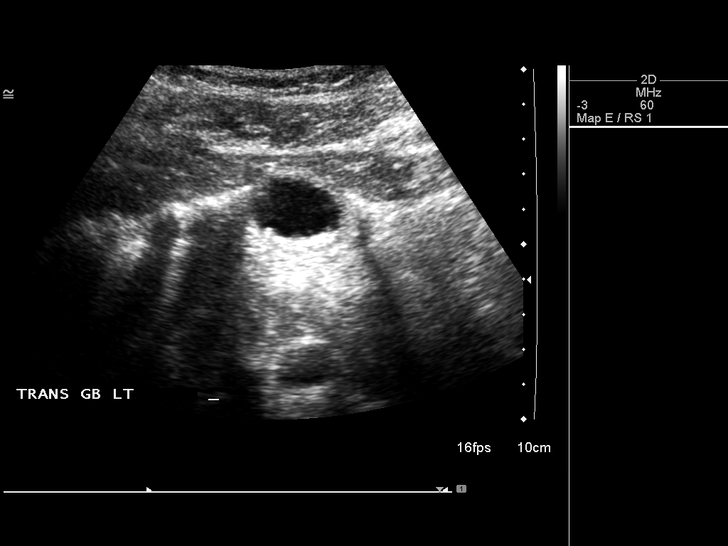
[im 55/55]
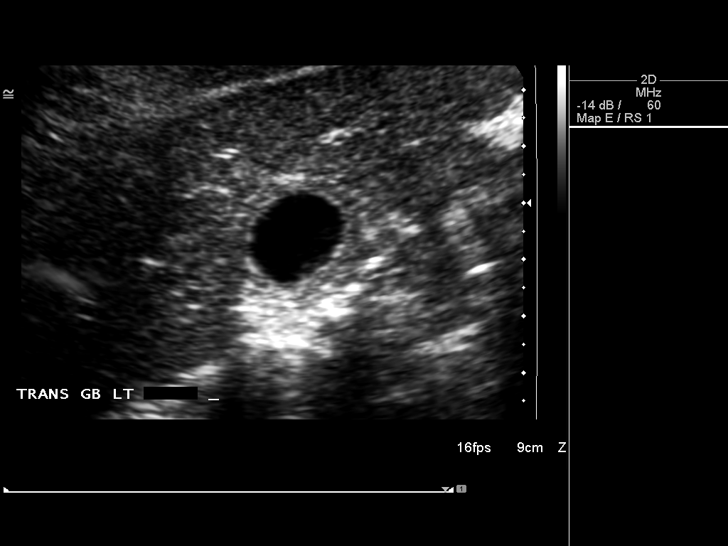

[14 of 25 positions shown; findings below may reference images not displayed]

FINDINGS: Gallbladder:  The gallbladder is visualized and there is
echodensity within the gallbladder which does not shadow consistent
with gallbladder sludge and possible small stones.  The gallbladder
wall is somewhat thickened measuring 3.7 mm in diameter.  However,
there is no pain over the gallbladder with compression.

Common bile duct:  The common bile duct is normal measuring 3.2 mm
in diameter.

Liver:  The liver has a normal echogenic pattern.  No ductal
dilatation is seen.
IMPRESSION: 1.  Echogenic debris in the gallbladder may represent gallbladder
sludge or small gallstones.
2.  Thickened gallbladder wall but no pain over the gallbladder
currently.
3.  No ductal dilatation.

## 2013-01-13 NOTE — L&D Delivery Note (Signed)
Delivery Note At 12:52 PM a viable female was delivered via Vaginal, Spontaneous Delivery (Presentation: Left Occiput Anterior).  APGAR: 9, 9; weight .   Placenta status: spontaneous, intact , .  Cord: 3 vessels with the following complications: None.  Cord pH: not indicated  Anesthesia: Epidural  Episiotomy:  Lacerations: 2nd degree Suture Repair: 3.0 vicryl rapide Est. Blood Loss (mL): 350  Mom to postpartum.  Baby to Couplet care / Skin to Skin.  Tirsa Gail A. 03/08/2013, 1:12 PM

## 2013-02-10 LAB — OB RESULTS CONSOLE GBS: STREP GROUP B AG: NEGATIVE

## 2013-02-24 IMAGING — RF DG CHOLANGIOGRAM OPERATIVE
1 series · 4 of 4 positions shown · non-contrast
Comparison: Ultrasound 09/05/2011.

CLINICAL DATA: Cholelithiasis.

INTRAOPERATIVE CHOLANGIOGRAM
TECHNIQUE: Cholangiographic images from the C-arm fluoroscopic
device were submitted for interpretation post-operatively.  Please
see the procedural report for the amount of contrast and the
fluoroscopy time utilized.

[Series 1: run · 4 of 28 frames shown]
[frame 5/28]
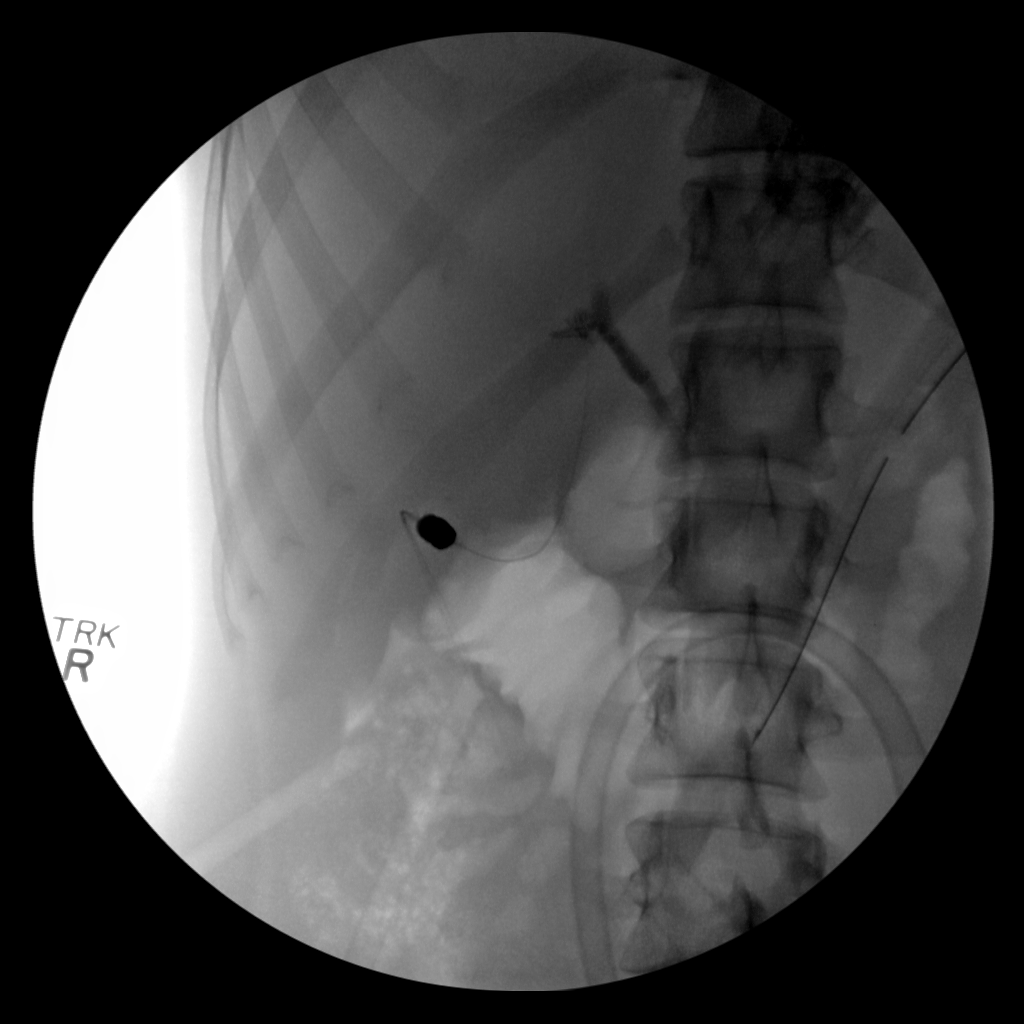
[frame 9/28]
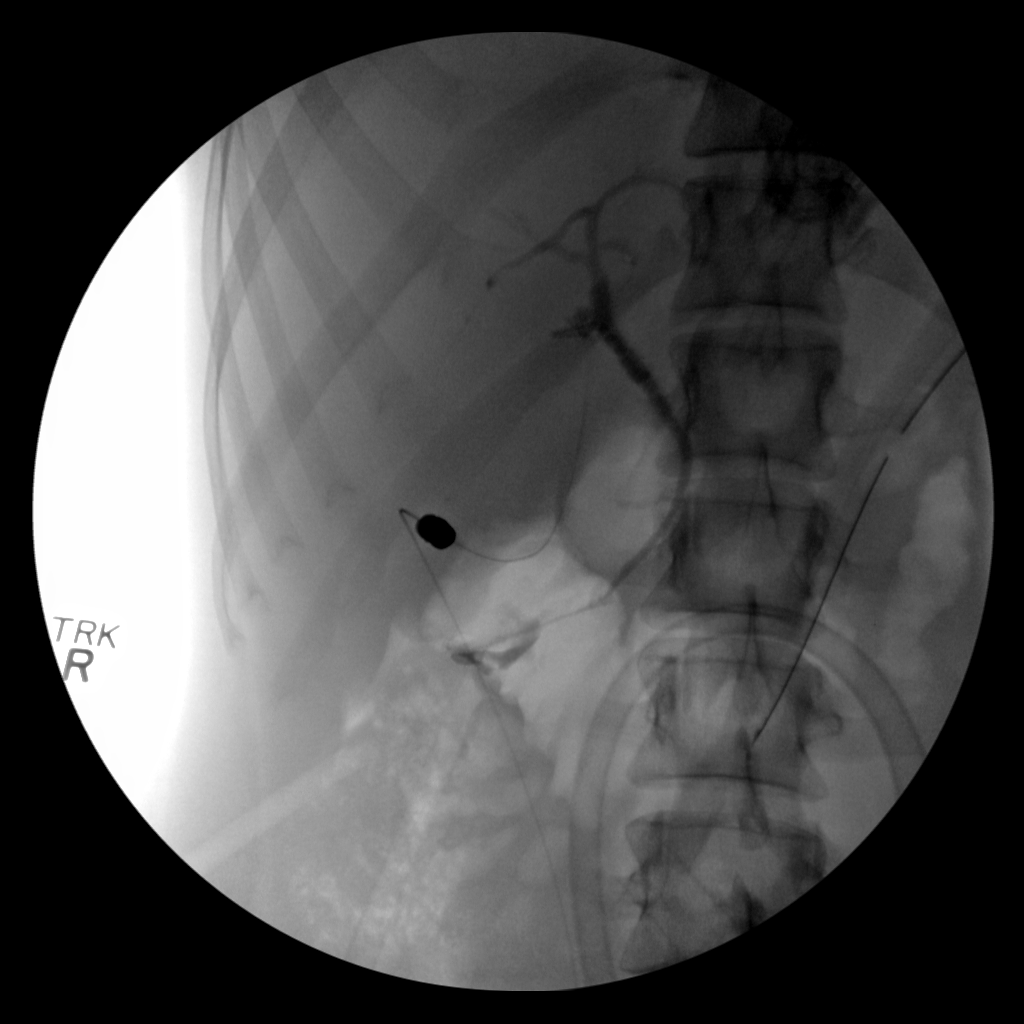
[frame 15/28]
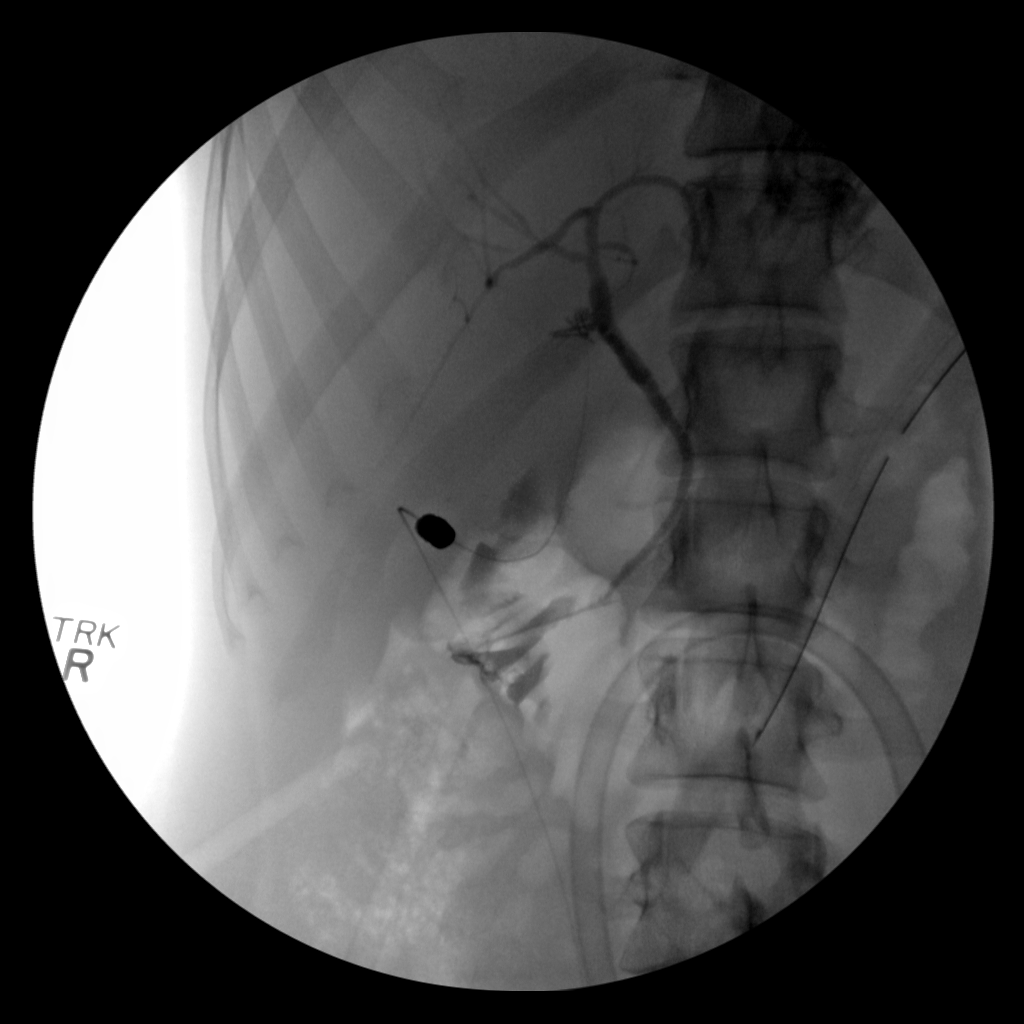
[frame 24/28]
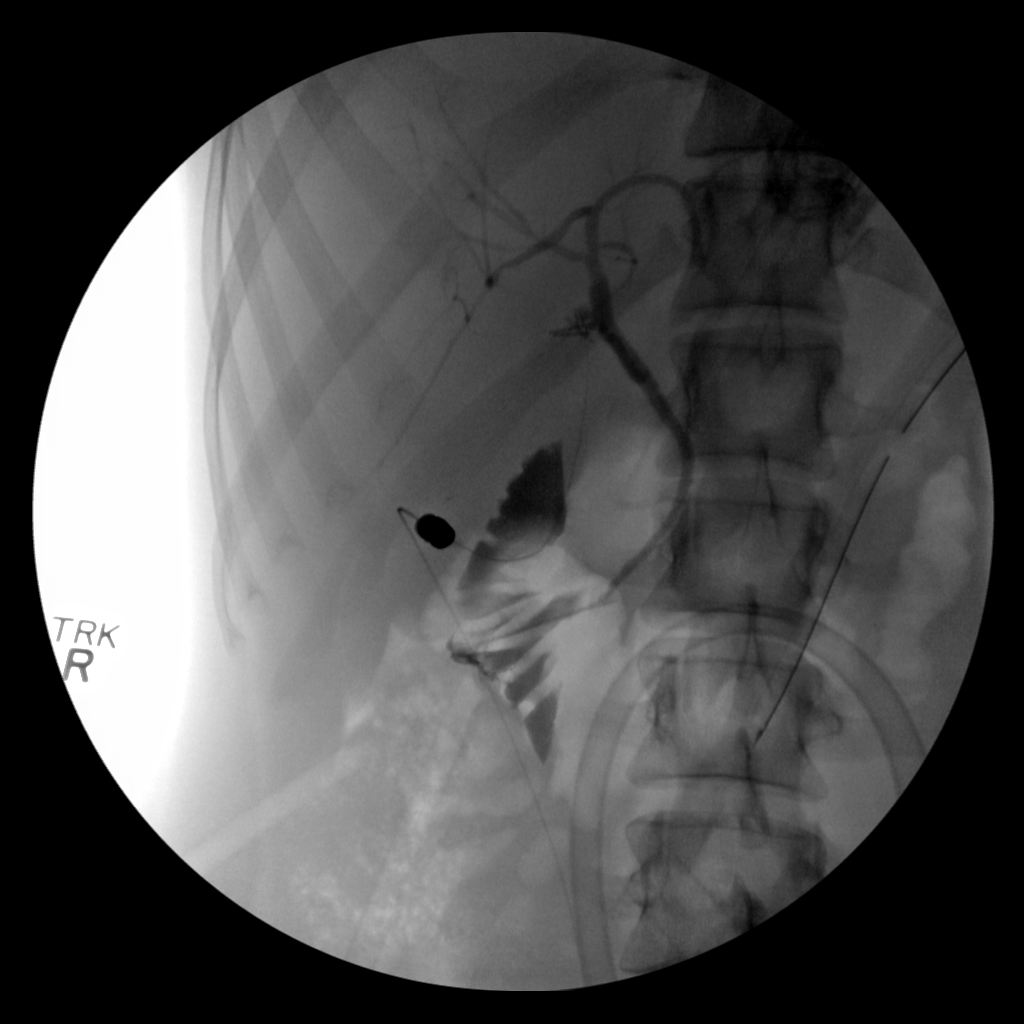

[4 of 4 positions shown; findings below may reference images not displayed]

FINDINGS: The gallbladder has been removed and the cystic duct
cannulated.  There is good opacification of the biliary tree.  No
filling defects are seen. There is no biliary ductal dilatation.
There is prompt passage of contrast into the duodenum.
IMPRESSION: Negative operative cholangiogram.

## 2013-03-04 ENCOUNTER — Encounter (HOSPITAL_COMMUNITY): Payer: Self-pay | Admitting: *Deleted

## 2013-03-04 ENCOUNTER — Other Ambulatory Visit: Payer: Self-pay | Admitting: Obstetrics

## 2013-03-04 ENCOUNTER — Telehealth (HOSPITAL_COMMUNITY): Payer: Self-pay | Admitting: *Deleted

## 2013-03-04 NOTE — Telephone Encounter (Signed)
Preadmission screen  

## 2013-03-08 ENCOUNTER — Encounter (HOSPITAL_COMMUNITY): Payer: Self-pay

## 2013-03-08 ENCOUNTER — Inpatient Hospital Stay (HOSPITAL_COMMUNITY): Payer: 59 | Admitting: Anesthesiology

## 2013-03-08 ENCOUNTER — Encounter (HOSPITAL_COMMUNITY): Payer: 59 | Admitting: Anesthesiology

## 2013-03-08 ENCOUNTER — Inpatient Hospital Stay (HOSPITAL_COMMUNITY)
Admission: RE | Admit: 2013-03-08 | Discharge: 2013-03-09 | DRG: 775 | Disposition: A | Discharge status: Home / Self Care | Payer: 59 | Source: Ambulatory Visit | Attending: Obstetrics | Admitting: Obstetrics

## 2013-03-08 DIAGNOSIS — F411 Generalized anxiety disorder: Secondary | ICD-10-CM | POA: Diagnosis present

## 2013-03-08 DIAGNOSIS — E039 Hypothyroidism, unspecified: Secondary | ICD-10-CM | POA: Diagnosis present

## 2013-03-08 DIAGNOSIS — O99284 Endocrine, nutritional and metabolic diseases complicating childbirth: Secondary | ICD-10-CM

## 2013-03-08 DIAGNOSIS — O99344 Other mental disorders complicating childbirth: Secondary | ICD-10-CM | POA: Diagnosis present

## 2013-03-08 DIAGNOSIS — Z3490 Encounter for supervision of normal pregnancy, unspecified, unspecified trimester: Secondary | ICD-10-CM

## 2013-03-08 DIAGNOSIS — O9902 Anemia complicating childbirth: Secondary | ICD-10-CM | POA: Diagnosis present

## 2013-03-08 DIAGNOSIS — K8010 Calculus of gallbladder with chronic cholecystitis without obstruction: Secondary | ICD-10-CM

## 2013-03-08 DIAGNOSIS — D649 Anemia, unspecified: Secondary | ICD-10-CM | POA: Diagnosis present

## 2013-03-08 DIAGNOSIS — O365990 Maternal care for other known or suspected poor fetal growth, unspecified trimester, not applicable or unspecified: Principal | ICD-10-CM | POA: Diagnosis present

## 2013-03-08 DIAGNOSIS — E079 Disorder of thyroid, unspecified: Secondary | ICD-10-CM | POA: Diagnosis present

## 2013-03-08 LAB — CBC
HCT: 35 % — ABNORMAL LOW (ref 36.0–46.0)
Hemoglobin: 12.4 g/dL (ref 12.0–15.0)
MCH: 31.1 pg (ref 26.0–34.0)
MCHC: 35.4 g/dL (ref 30.0–36.0)
MCV: 87.7 fL (ref 78.0–100.0)
Platelets: 281 10*3/uL (ref 150–400)
RBC: 3.99 MIL/uL (ref 3.87–5.11)
RDW: 12.9 % (ref 11.5–15.5)
WBC: 8.3 10*3/uL (ref 4.0–10.5)

## 2013-03-08 LAB — RPR: RPR Ser Ql: NONREACTIVE

## 2013-03-08 LAB — ABO/RH: ABO/RH(D): O POS

## 2013-03-08 MED ORDER — SIMETHICONE 80 MG PO CHEW: 80.0000 mg | CHEWABLE_TABLET | ORAL | Status: DC | PRN

## 2013-03-08 MED ORDER — IBUPROFEN 600 MG PO TABS
600.0000 mg | ORAL_TABLET | Freq: Four times a day (QID) | ORAL | Status: DC
  Administered 2013-03-08 – 2013-03-09 (×4): 600 mg via ORAL
  Filled 2013-03-08 (×4): qty 1

## 2013-03-08 MED ORDER — EPHEDRINE 5 MG/ML INJ
INTRAVENOUS | Status: AC
  Filled 2013-03-08: qty 4

## 2013-03-08 MED ORDER — FLEET ENEMA 7-19 GM/118ML RE ENEM: 1.0000 | ENEMA | RECTAL | Status: DC | PRN

## 2013-03-08 MED ORDER — OXYCODONE-ACETAMINOPHEN 5-325 MG PO TABS: 1.0000 | ORAL_TABLET | ORAL | Status: DC | PRN

## 2013-03-08 MED ORDER — PRENATAL MULTIVITAMIN CH
1.0000 | ORAL_TABLET | Freq: Every day | ORAL | Status: DC
  Administered 2013-03-09: 1 via ORAL
  Filled 2013-03-08: qty 1

## 2013-03-08 MED ORDER — LIDOCAINE HCL (PF) 1 % IJ SOLN
30.0000 mL | INTRAMUSCULAR | Status: DC | PRN
  Filled 2013-03-08: qty 30

## 2013-03-08 MED ORDER — OXYTOCIN 40 UNITS IN LACTATED RINGERS INFUSION - SIMPLE MED
1.0000 m[IU]/min | INTRAVENOUS | Status: DC
  Administered 2013-03-08: 2 m[IU]/min via INTRAVENOUS
  Filled 2013-03-08: qty 1000

## 2013-03-08 MED ORDER — LIDOCAINE HCL (PF) 1 % IJ SOLN
INTRAMUSCULAR | Status: DC | PRN
  Administered 2013-03-08 (×2): 5 mL

## 2013-03-08 MED ORDER — PHENYLEPHRINE 40 MCG/ML (10ML) SYRINGE FOR IV PUSH (FOR BLOOD PRESSURE SUPPORT)
80.0000 ug | PREFILLED_SYRINGE | INTRAVENOUS | Status: DC | PRN
Start: 2013-03-08 — End: 2013-03-08
  Filled 2013-03-08: qty 2

## 2013-03-08 MED ORDER — SENNOSIDES-DOCUSATE SODIUM 8.6-50 MG PO TABS
2.0000 | ORAL_TABLET | ORAL | Status: DC
  Administered 2013-03-09: 2 via ORAL
  Filled 2013-03-08: qty 2

## 2013-03-08 MED ORDER — ONDANSETRON HCL 4 MG/2ML IJ SOLN: 4.0000 mg | Freq: Four times a day (QID) | INTRAMUSCULAR | Status: DC | PRN

## 2013-03-08 MED ORDER — ONDANSETRON HCL 4 MG PO TABS: 4.0000 mg | ORAL_TABLET | ORAL | Status: DC | PRN

## 2013-03-08 MED ORDER — BISACODYL 10 MG RE SUPP: 10.0000 mg | Freq: Every day | RECTAL | Status: DC | PRN

## 2013-03-08 MED ORDER — DIPHENHYDRAMINE HCL 25 MG PO CAPS: 25.0000 mg | ORAL_CAPSULE | Freq: Four times a day (QID) | ORAL | Status: DC | PRN

## 2013-03-08 MED ORDER — ACETAMINOPHEN 325 MG PO TABS: 650.0000 mg | ORAL_TABLET | ORAL | Status: DC | PRN

## 2013-03-08 MED ORDER — LANOLIN HYDROUS EX OINT: TOPICAL_OINTMENT | CUTANEOUS | Status: DC | PRN

## 2013-03-08 MED ORDER — DIPHENHYDRAMINE HCL 50 MG/ML IJ SOLN: 12.5000 mg | INTRAMUSCULAR | Status: DC | PRN

## 2013-03-08 MED ORDER — WITCH HAZEL-GLYCERIN EX PADS: 1.0000 "application " | MEDICATED_PAD | CUTANEOUS | Status: DC | PRN

## 2013-03-08 MED ORDER — BENZOCAINE-MENTHOL 20-0.5 % EX AERO: 1.0000 "application " | INHALATION_SPRAY | CUTANEOUS | Status: DC | PRN

## 2013-03-08 MED ORDER — ZOLPIDEM TARTRATE 5 MG PO TABS: 5.0000 mg | ORAL_TABLET | Freq: Every evening | ORAL | Status: DC | PRN

## 2013-03-08 MED ORDER — CITRIC ACID-SODIUM CITRATE 334-500 MG/5ML PO SOLN: 30.0000 mL | ORAL | Status: DC | PRN

## 2013-03-08 MED ORDER — EPHEDRINE 5 MG/ML INJ
10.0000 mg | INTRAVENOUS | Status: DC | PRN
  Filled 2013-03-08: qty 2

## 2013-03-08 MED ORDER — FENTANYL 2.5 MCG/ML BUPIVACAINE 1/10 % EPIDURAL INFUSION (WH - ANES)
INTRAMUSCULAR | Status: AC
  Filled 2013-03-08: qty 125

## 2013-03-08 MED ORDER — OXYTOCIN BOLUS FROM INFUSION: 500.0000 mL | INTRAVENOUS | Status: DC

## 2013-03-08 MED ORDER — DIBUCAINE 1 % RE OINT: 1.0000 "application " | TOPICAL_OINTMENT | RECTAL | Status: DC | PRN

## 2013-03-08 MED ORDER — PHENYLEPHRINE 40 MCG/ML (10ML) SYRINGE FOR IV PUSH (FOR BLOOD PRESSURE SUPPORT)
PREFILLED_SYRINGE | INTRAVENOUS | Status: AC
  Filled 2013-03-08: qty 10

## 2013-03-08 MED ORDER — FENTANYL 2.5 MCG/ML BUPIVACAINE 1/10 % EPIDURAL INFUSION (WH - ANES)
14.0000 mL/h | INTRAMUSCULAR | Status: DC | PRN
  Administered 2013-03-08: 14 mL/h via EPIDURAL

## 2013-03-08 MED ORDER — TERBUTALINE SULFATE 1 MG/ML IJ SOLN: 0.2500 mg | Freq: Once | INTRAMUSCULAR | Status: DC | PRN

## 2013-03-08 MED ORDER — LACTATED RINGERS IV SOLN
INTRAVENOUS | Status: DC
  Administered 2013-03-08 (×2): 1000 mL via INTRAVENOUS

## 2013-03-08 MED ORDER — BUSPIRONE HCL 5 MG PO TABS
5.0000 mg | ORAL_TABLET | Freq: Every day | ORAL | Status: DC
  Administered 2013-03-08: 5 mg via ORAL
  Filled 2013-03-08 (×2): qty 1

## 2013-03-08 MED ORDER — LACTATED RINGERS IV SOLN
500.0000 mL | INTRAVENOUS | Status: DC | PRN
  Administered 2013-03-08: 500 mL via INTRAVENOUS

## 2013-03-08 MED ORDER — FLEET ENEMA 7-19 GM/118ML RE ENEM: 1.0000 | ENEMA | Freq: Every day | RECTAL | Status: DC | PRN

## 2013-03-08 MED ORDER — PHENYLEPHRINE 40 MCG/ML (10ML) SYRINGE FOR IV PUSH (FOR BLOOD PRESSURE SUPPORT)
80.0000 ug | PREFILLED_SYRINGE | INTRAVENOUS | Status: DC | PRN
  Filled 2013-03-08: qty 2

## 2013-03-08 MED ORDER — IBUPROFEN 600 MG PO TABS: 600.0000 mg | ORAL_TABLET | Freq: Four times a day (QID) | ORAL | Status: DC | PRN

## 2013-03-08 MED ORDER — TETANUS-DIPHTH-ACELL PERTUSSIS 5-2.5-18.5 LF-MCG/0.5 IM SUSP: 0.5000 mL | Freq: Once | INTRAMUSCULAR | Status: DC

## 2013-03-08 MED ORDER — ONDANSETRON HCL 4 MG/2ML IJ SOLN: 4.0000 mg | INTRAMUSCULAR | Status: DC | PRN

## 2013-03-08 MED ORDER — LACTATED RINGERS IV SOLN: 500.0000 mL | Freq: Once | INTRAVENOUS | Status: DC

## 2013-03-08 MED ORDER — LEVOTHYROXINE SODIUM 100 MCG PO TABS
100.0000 ug | ORAL_TABLET | Freq: Every day | ORAL | Status: DC
  Administered 2013-03-09: 100 ug via ORAL
  Filled 2013-03-08 (×2): qty 1

## 2013-03-08 MED ORDER — OXYTOCIN 40 UNITS IN LACTATED RINGERS INFUSION - SIMPLE MED: 62.5000 mL/h | INTRAVENOUS | Status: DC

## 2013-03-08 NOTE — Progress Notes (Signed)
Stacy Williams is a 33 y.o. G2P1001 at 5264w1d by LMP admitted for induction of labor due to Poor fetal growth.  Subjective: comfortable  Objective: BP 111/75  Pulse 75  Temp(Src) 98.8 F (37.1 C) (Oral)  Resp 16  Ht 5\' 2"  (1.575 m)  Wt 69.854 kg (154 lb)  BMI 28.16 kg/m2  LMP 06/03/2012      FHT:  FHR: 135 bpm, variability: moderate,  accelerations:  Present,  decelerations:  Absent UC:   irregular, every 8 minutes SVE:   Dilation: 3.5 Effacement (%): 80 Station: 0 Exam by:: Dr Billy Coastaavon AROM-clear  Labs: Lab Results  Component Value Date   WBC 8.3 03/08/2013   HGB 12.4 03/08/2013   HCT 35.0* 03/08/2013   MCV 87.7 03/08/2013   PLT 281 03/08/2013    Assessment / Plan: Induction of labor due to IUGR,  progressing well on pitocin  Labor: Progressing normally Preeclampsia:  no signs or symptoms of toxicity Fetal Wellbeing:  Category I Pain Control:  Labor support without medications I/D:  n/a Anticipated MOD:  NSVD  Stacy Williams 03/08/2013, 9:56 AM

## 2013-03-08 NOTE — Anesthesia Procedure Notes (Signed)
Epidural Patient location during procedure: OB Start time: 03/08/2013 11:08 AM End time: 03/08/2013 11:18 AM  Staffing Anesthesiologist: Myra Weng, CHRIS Performed by: anesthesiologist   Preanesthetic Checklist Completed: patient identified, surgical consent, pre-op evaluation, timeout performed, IV checked, risks and benefits discussed and monitors and equipment checked  Epidural Patient position: sitting Prep: site prepped and draped and DuraPrep Patient monitoring: heart rate, cardiac monitor, continuous pulse ox and blood pressure Approach: midline Location: L3-L4 Injection technique: LOR saline  Needle:  Needle type: Tuohy  Needle gauge: 17 G Needle length: 9 cm Needle insertion depth: 5 cm Catheter type: closed end flexible Catheter size: 19 Gauge Catheter at skin depth: 11 cm Test dose: Other  Assessment Events: blood not aspirated, injection not painful, no injection resistance, negative IV test and no paresthesia  Additional Notes H+P and labs checked, risks and benefits discussed with the patient, consent obtained, procedure tolerated well and without complications.  Reason for block:procedure for pain

## 2013-03-08 NOTE — Progress Notes (Signed)
Patient was referred for history of depression/anxiety.  * Referral screened out by Clinical Social Worker because none of the following criteria appear to apply:  ~ History of anxiety/depression during this pregnancy, or of post-partum depression.  ~ Diagnosis of anxiety and/or depression within last 3 years  ~ History of depression due to pregnancy loss/loss of child  OR  * Patient's symptoms currently being treated with medication (Buspar) and/or therapy.  Please contact the Clinical Social Worker if needs arise, or by the patient's request.      

## 2013-03-08 NOTE — H&P (Signed)
Stacy Williams is a 33 y.o. G2P1001 at 180w1d presenting for IOL. Pt notes rare contractions. Good fetal movement, No vaginal bleeding, not leaking fluid.  [facility] 6 wks - Dated by 6 wk u/s - SGA with borderline growth. SGA noted at 36 wks. Adequate AC growth at 38 wks. Plan IOL at 39 wks - Anxiety, on Buspar - hypothyroid. On Synthroid   Prenatal Transfer Tool  Maternal Diabetes: No Genetic Screening: Normal Maternal Ultrasounds/Referrals: Abnormal:  Findings:   IUGR Fetal Ultrasounds or other Referrals:  None Maternal Substance Abuse:  No Significant Maternal Medications:  None Significant Maternal Lab Results: None     OB History   Grav Para Term Preterm Abortions TAB SAB Ect Mult Living   2 1 1       1      Past Medical History  Diagnosis Date  . Thyroid disease     hypothyroidism  . Sinusitis   . Anxiety   . Cervical ectropion   . 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 Hip dysplasia, congenital   . Hypothyroidism   . Seasonal allergies   . Family history of anesthesia complication     1/2 sister has severe N/V after anesthesia   Past Surgical History  Procedure Laterality Date  . No past surgeries    . Cholecystectomy  10/30/2011    Procedure: LAPAROSCOPIC CHOLECYSTECTOMY WITH INTRAOPERATIVE CHOLANGIOGRAM;  Surgeon: Robyne AskewPaul S Toth III, MD;  [facility];  Service: General;  Laterality: N/A;  . Wisdom tooth extraction     Family History: family history includes Cancer in her maternal grandmother and paternal aunt. Social History:  reports that she quit smoking about 11 years ago. Her smoking use included Cigarettes. She smoked 0.00 packs per day. She does not have any smokeless tobacco history on file. She reports that she drinks about 1.8 ounces of alcohol per week. She reports that she does not use illicit drugs.  Review of Systems - Negative except pregnancy   Dilation: 3.5 Effacement (%): 80 Station: 0 Exam by:: Dr Billy Coastaavon Blood pressure  110/80, pulse 88, temperature 98.8 F (37.1 C), temperature source Oral, resp. rate 16, height 5\' 2"  (1.575 m), weight 69.854 kg (154 lb), last menstrual period 06/03/2012.  Physical Exam:  Gen: well appearing, no distress CV: RRR Pulm: CTAB Back: no CVAT Abd: gravid, NT, no RUQ pain LE: no edema, equal bilaterally, non-tender   Prenatal labs: ABO, Rh: O/Positive/-- (07/31 0000) Antibody: Negative (07/31 0000) Rubella:  immune RPR: Nonreactive (07/31 0000)  HBsAg: Negative (07/31 0000)  HIV: Non-reactive (07/31 0000)  GBS: Negative (01/29 0000)  1 hr Glucola passed  Genetic screening nl Anatomy US nl   Assessment/Plan: 33 y.o. G2P1001 at 8380w1d IOL due to SGA and term multip   Ryszard Socarras A. 03/08/2013, 9:02 AM

## 2013-03-08 NOTE — Anesthesia Preprocedure Evaluation (Signed)
Anesthesia Evaluation  Patient identified by MRN, date of birth, ID band Patient awake    Reviewed: Allergy & Precautions, H&P , NPO status , Patient's Chart, lab work & pertinent test results  History of Anesthesia Complications Negative for: history of anesthetic complications  Airway Mallampati: II TM Distance: >3 FB Neck ROM: Full    Dental  (+) Teeth Intact   Pulmonary neg shortness of breath, neg sleep apnea, neg COPDformer smoker,    Pulmonary exam normal       Cardiovascular negative cardio ROS  Rhythm:Regular Rate:Normal     Neuro/Psych Anxiety negative neurological ROS     GI/Hepatic negative GI ROS, Neg liver ROS,   Endo/Other  Hypothyroidism   Renal/GU negative Renal ROS  negative genitourinary   Musculoskeletal negative musculoskeletal ROS (+)   Abdominal   Peds  Hematology  (+) anemia ,   Anesthesia Other Findings   Reproductive/Obstetrics (+) Pregnancy                           Anesthesia Physical Anesthesia Plan  ASA: II  Anesthesia Plan: Epidural   Post-op Pain Management:    Induction:   Airway Management Planned: Natural Airway  Additional Equipment:   Intra-op Plan:   Post-operative Plan:   Informed Consent: I have reviewed the patients History and Physical, chart, labs and discussed the procedure including the risks, benefits and alternatives for the proposed anesthesia with the patient or authorized representative who has indicated his/her understanding and acceptance.   Dental advisory given  Plan Discussed with: Anesthesiologist  Anesthesia Plan Comments:         Anesthesia Quick Evaluation

## 2013-03-08 NOTE — Lactation Note (Signed)
This note was copied from the chart of Stacy Williams. Lactation Consultation Note  Patient Name: Stacy Sonda Whittingham Today's Date: 03/08/2013 Reason for consult: Initial assessment of this second-time mom and her newborn at 629 hours of age. Mom states she was given comfort gelpads by her nurse.  LC reviewed hand expression of milk for nipple care and comfort gelpads for between feedings, as needed.  Mom states she breastfed first child for 1 year (now 234 yo) but returned to work and needed to supplement after 9 months. Baby's initial LATCH score was 10 and mom aware of signs of proper latch.  LC encouraged STS and cue feedings. LC encouraged review of Baby and Me pp 9, 14 and 20-25 for STS and BF information. LC provided [facility] Resource brochure and reviewed [facility] services and list of community and web site resources.      Maternal Data Formula Feeding for Exclusion: No Infant to breast within first hour of birth: Yes (LATCH scores of 8 and 10 after delivery) Has patient been taught Hand Expression?: Yes (mom states she knows how to express her milk by hand) Does the patient have breastfeeding experience prior to this delivery?: Yes  Feeding Feeding Type: Breast Fed Length of feed: 15 min  LATCH Score/Interventions           initial LATCH score=10 per RN assessment           Lactation Tools Discussed/Used   STS, cue feedings, hand expression Comfort gelpads  Consult Status Consult Status: Follow-up Date: 03/09/13 Follow-up type: In-patient    Warrick ParisianBryant, Melisa Donofrio [facility] 03/08/2013, 10:38 PM

## 2013-03-09 LAB — CBC
HEMATOCRIT: 32.6 % — AB (ref 36.0–46.0)
Hemoglobin: 11.2 g/dL — ABNORMAL LOW (ref 12.0–15.0)
MCH: 30.9 pg (ref 26.0–34.0)
MCHC: 34.4 g/dL (ref 30.0–36.0)
MCV: 89.8 fL (ref 78.0–100.0)
Platelets: 252 10*3/uL (ref 150–400)
RBC: 3.63 MIL/uL — ABNORMAL LOW (ref 3.87–5.11)
RDW: 13.2 % (ref 11.5–15.5)
WBC: 12.4 10*3/uL — ABNORMAL HIGH (ref 4.0–10.5)

## 2013-03-09 MED ORDER — SENNOSIDES-DOCUSATE SODIUM 8.6-50 MG PO TABS: 2.0000 | ORAL_TABLET | Freq: Every evening | ORAL | Status: AC | PRN

## 2013-03-09 MED ORDER — IBUPROFEN 600 MG PO TABS: 600.0000 mg | ORAL_TABLET | Freq: Four times a day (QID) | ORAL | Status: AC | PRN

## 2013-03-09 NOTE — Discharge Summary (Signed)
Obstetric Discharge Summary Reason for Admission: G2 P1 0 0 1 for IOL @ 39w 1d d/t SGA.  Some mild contrations at present.    Hx anxiety, stable on buspar and hx hypothyroidism, stable on synthroid Prenatal Procedures: NST and ultrasound Intrapartum Procedures: spontaneous vaginal delivery Postpartum Procedures: none Complications-Operative and Postpartum: 2nd degree perineal laceration Hemoglobin  Date Value Ref Range Status  03/09/2013 11.2* 12.0 - 15.0 g/dL Final     HCT  Date Value Ref Range Status  03/09/2013 32.6* 36.0 - 46.0 % Final    Physical Exam:  General: alert, cooperative and no distress Lochia: appropriate Uterine Fundus: firm Incision: 2nd deg lac with repair; reapproximated DVT Evaluation: No evidence of DVT seen on physical exam. Negative Homan's sign.  Discharge Diagnoses: G2 P2 0 0 2 s/p SVD with 2nd deg repair at 39wks.  Infant SGA, 5lb 10oz  Discharge Information: Date: 03/09/2013 Activity: pelvic rest Diet: routine Medications: PNV, Ibuprofen and Colace Condition: stable Instructions: refer to practice specific booklet Discharge to: home Follow-up Information   [facility] 6 weeks.   Specialty:  Obstetrics and Gynecology   Contact information:   [address] Severe[address] [place] [id number] [phone]       Newborn Data: Live born female on 03/08/13 Birth Weight: 5 lb 10 oz (2551 g) APGAR: 9, 9  Home with mother.  Lestine Rahe K 03/09/2013, 10:29 AM

## 2013-03-09 NOTE — Anesthesia Postprocedure Evaluation (Signed)
Anesthesia Post Note  Patient: Stacy Williams  Procedure(s) Performed: * No procedures listed *  Anesthesia type: Epidural  Patient location: Mother/Baby  Post pain: Pain level controlled  Post assessment: Post-op Vital signs reviewed  Last Vitals:  Filed Vitals:   03/09/13 0636  BP: 122/75  Pulse: 91  Temp: 36.6 C  Resp: 18    Post vital signs: Reviewed  Level of consciousness: awake  Complications: No apparent anesthesia complications

## 2013-03-09 NOTE — Progress Notes (Signed)
Patient ID: Stacy Williams, female   DOB: 09/09/1980, 33 y.o.   MRN: [id number] PPD # 1  Subjective: Pt reports feeling well and eager for early d/c home / Pain controlled with ibuprofen Tolerating po/ Voiding without problems/ No n/v Bleeding is light/ Newborn info:  Information for the patient's newborn:  Stacy Williams [[id number]]  female Feeding: breast    Objective:  VS: Blood pressure 122/75, pulse 91, temperature 97.9 F (36.6 C), temperature source Oral, resp. rate 18.    Recent Labs  03/08/13 0650 03/09/13 0615  WBC 8.3 12.4*  HGB 12.4 11.2*  HCT 35.0* 32.6*  PLT 281 252    Blood type: O POS Rubella: Immune    Physical Exam:  General:  alert, cooperative and no distress CV: Regular rate and rhythm Resp: clear Abdomen: soft, nontender, normal bowel sounds Uterine Fundus: firm, below umbilicus, nontender Perineum: healing with good reapproximation and mild edema Lochia: minimal Ext: edema trace and Homans sign is negative, no sign of DVT    A/P: PPD # 1 / G2P2002/ S/P: SVD w/ 2nd deg lac with repair Hx anxiety and hypothyroidism, both stable Doing well and stable for discharge home, if peds approves infant d/c today. RX: Ibuprofen 600mg  po Q 6 hrs prn pain #30 Refill x 1 Colace 100mg  po up to TID prn #30 Ref x 1 WOB/GYN booklet given Routine pp visit in 6wks   Stacy RevelFISHER,Baldo Hufnagle K, MSN, [facility] 03/09/2013, 10:26 AM

## 2013-03-09 NOTE — Lactation Note (Addendum)
This note was copied from the chart of Stacy Williams. Lactation Consultation Note  Patient Name: Stacy Williams Today's Date: 03/09/2013 Reason for consult: Follow-up assessment;Breast/nipple pain;Difficult latch Mom reports baby has been sleepy and not wanting to latch. Mom c/o of nipple pain, some cracking observed. Mom has aerola edema, short nipple shaft. Attempted to latch baby in several positions but she could not obtain/sustain depth and latch. Initiated #20 nipple shield and after few attempts baby was able to latch and obtain good depth demonstrating a good rhythmic suck, few swallows noted.  Scant amount of colostrum in nipple shield, colostrum present with hand expression. After the feeding, Mom's nipple was more erect and changed nipple shield size to #16 for better fit. Gave both to Mom for d/c today and reviewed how to assess for proper fit. BF basics reviewed with Mom. Care for sore nipples reviewed with Mom, she has comfort gels as well. Engorgement care reviewed with Mom. Advised to monitor voids/stools. Advised Mom to pre-pump to get her flow going when latching baby. Post pump to encourage milk production unless breasts becoming over full. Demonstrated how to give EBM back to baby using foley cup or curved tipped syringe.  OP f/u scheduled for Monday, 03/14/13 at 2:30pm.   Maternal Data    Feeding Feeding Type: Breast Fed Length of feed: 25 min  LATCH Score/Interventions Latch: Repeated attempts needed to sustain latch, nipple held in mouth throughout feeding, stimulation needed to elicit sucking reflex. (initiated #20 nipple shield) Intervention(s): Assist with latch;Breast massage;Adjust position;Breast compression  Audible Swallowing: A few with stimulation  Type of Nipple: Everted at rest and after stimulation (areola swelling, short nipple shaft)  Comfort (Breast/Nipple): Engorged, cracked, bleeding, large blisters, severe discomfort Problem noted: Cracked,  bleeding, blisters, bruises Intervention(s): Expressed breast milk to nipple;Other (comment) (comfort gels)     Hold (Positioning): Assistance needed to correctly position infant at breast and maintain latch. Intervention(s): Breastfeeding basics reviewed;Support Pillows;Position options;Skin to skin  LATCH Score: 5  Lactation Tools Discussed/Used Tools: Nipple Shields;Comfort gels Nipple shield size: 20;16   Consult Status Consult Status: Complete Date: 03/09/13 Follow-up type: In-patient    Stacy Williams, Stacy Williams 03/09/2013, 1:11 PM

## 2013-03-12 ENCOUNTER — Inpatient Hospital Stay (HOSPITAL_COMMUNITY): Admission: AD | Admit: 2013-03-12 | Payer: Self-pay | Source: Ambulatory Visit | Admitting: Obstetrics

## 2013-03-14 ENCOUNTER — Ambulatory Visit (HOSPITAL_COMMUNITY)
Admit: 2013-03-14 | Discharge: 2013-03-14 | Disposition: A | Discharge status: Home / Self Care | Payer: 59 | Attending: Obstetrics | Admitting: Obstetrics

## 2013-03-14 NOTE — Lactation Note (Signed)
Adult Lactation Consultation Outpatient Visit Note  Patient Name: Stacy Williams                                            "[name]" Date of Birth: 09/09/1980                                                       BW: 5-12 Gestational Age at Delivery: 6459w1d                                   386 days old Type of Delivery: Vaginal                                                    [phone]  Breastfeeding History: Frequency of Breastfeeding: every 2-3 hours Length of Feeding: 20-25 mins Voids:8-10  Stools:2-3 brownish yellow  Supplementing / Method: Pumping:  Type of Pump:Medela Pump N Style   Frequency:not pumping( has pumped 2 times since birth)   Volume:  2 ounces  Comments:Mother was fit with a NS while in the hospital.Infant has a poor latch. She was also given comfort gels for cracking.  Mother used the NS for only a few feedings.  Mother breastfed for 9 months  with first child.  Mother states that infant bits and chews for 20-30 mins before infant sustains latch. Infant will breastfeed 20-25 min on one side. Mother is having pain. She states that pain scale is a #10 when infant is feeding. Mother has bilateral cracking and bleeding. (L) breast is worse. Cracks are at the back of the nipple shaft.    Consultation Evaluation: Observed that mother has a good breastmilk supply. She is very firm and full.  I had mother to hand express, do reverse pressure to soften the areola.  Several attempts to latch infant. Infant goes on very shallow for about 10 -20 second. Adjusted infants lower jaw for wider gape.  Infant sustained latch for 14 mins on (L) and another 7 mins on (R) breast.  Observed that nipple has a   Infant has a slight posterior tongue tie. Recommend to try latching infant deeply. If unable to get nipples healed, have infants tongue mobility evaluated by Peds.   Initial Feeding Assessment: Pre-feed [id number]:2542 Post-feed Weight:2590 Amount Transferred:48  ml Comments:  Additional Feeding Assessment: Pre-feed Weight:2590 Post-feed [id number]:2604 Amount Transferred:14 ml Comments:    Total Breast milk Transferred this Visit: 62 ml Total Supplement Given:   Additional Interventions:  Recommend that mother phone her OB for APNO Advised to hand express, use reverse pressure and pre-pump 3-4 mins before latching Mother to use nipple to nose latch on technique Rotate positions frequently Pump once daily to drain breast well. At least 20 mins.  Mother to follow up as needed.   Follow-Up  PRN    Stevan BornKendrick, Reniya Mcclees [facility] 03/14/2013, 2:38 PM

## 2013-05-19 ENCOUNTER — Other Ambulatory Visit: Payer: Self-pay | Admitting: Physician Assistant

## 2013-11-14 ENCOUNTER — Encounter (HOSPITAL_COMMUNITY): Payer: Self-pay

## 2015-11-23 ENCOUNTER — Other Ambulatory Visit: Payer: Self-pay | Admitting: Ophthalmology

## 2017-12-25 ENCOUNTER — Ambulatory Visit (HOSPITAL_COMMUNITY)
Admission: RE | Admit: 2017-12-25 | Discharge: 2017-12-25 | Disposition: A | Discharge status: Home / Self Care | Payer: 59 | Source: Ambulatory Visit | Attending: Obstetrics and Gynecology | Admitting: Obstetrics and Gynecology

## 2017-12-25 ENCOUNTER — Other Ambulatory Visit (HOSPITAL_COMMUNITY): Payer: Self-pay | Admitting: Obstetrics and Gynecology

## 2017-12-25 DIAGNOSIS — M533 Sacrococcygeal disorders, not elsewhere classified: Secondary | ICD-10-CM

## 2018-08-18 ENCOUNTER — Other Ambulatory Visit: Payer: Self-pay

## 2018-08-18 DIAGNOSIS — Z20822 Contact with and (suspected) exposure to covid-19: Secondary | ICD-10-CM

## 2018-08-19 LAB — NOVEL CORONAVIRUS, NAA: SARS-CoV-2, NAA: NOT DETECTED

## 2018-10-15 ENCOUNTER — Telehealth: Payer: 59 | Admitting: Physician Assistant

## 2018-10-15 ENCOUNTER — Other Ambulatory Visit: Payer: Self-pay

## 2018-10-15 DIAGNOSIS — Z20822 Contact with and (suspected) exposure to covid-19: Secondary | ICD-10-CM

## 2018-10-15 DIAGNOSIS — Z20828 Contact with and (suspected) exposure to other viral communicable diseases: Secondary | ICD-10-CM

## 2018-10-15 MED ORDER — PROMETHAZINE-DM 6.25-15 MG/5ML PO SYRP: 5.0000 mL | ORAL_SOLUTION | Freq: Four times a day (QID) | ORAL | 0 refills | Status: AC | PRN

## 2018-10-15 NOTE — Progress Notes (Signed)
E-Visit for Corona Virus Screening   Your current symptoms could be consistent with the coronavirus.  Many health care providers can now test patients at their office but not all are.  [place] has multiple testing sites. For information on our COVID testing locations and hours go to [email or web address]  Please quarantine yourself while awaiting your test results.  We are enrolling you in our MyChart Home Montioring for COVID19 . Daily you will receive a questionnaire within the MyChart website. Our COVID 19 response team willl be monitoriing your responses daily.    COVID-19 is a respiratory illness with symptoms that are similar to the flu. Symptoms are typically mild to moderate, but there have been cases of severe illness and death due to the virus. The following symptoms may appear 2-14 days after exposure: . Fever . Cough . Shortness of breath or difficulty breathing . Chills . Repeated shaking with chills . Muscle pain . Headache . Sore throat . New loss of taste or smell . Fatigue . Congestion or runny nose . Nausea or vomiting . Diarrhea  It is vitally important that if you feel that you have an infection such as this virus or any other virus that you stay home and away from places where you may spread it to others.  You should self-quarantine for 14 days if you have symptoms that could potentially be coronavirus or have been in close contact a with a person diagnosed with COVID-19 within the last 2 weeks. You should avoid contact with people age 65 and older.   You should wear a mask or cloth face covering over your nose and mouth if you must be around other people or animals, including pets (even at home). Try to stay at least 6 feet away from other people. This will protect the people around you.  You can use medication such as A prescription cough medication called Phenergan DM 6.25 mg/15 mg. You make take one teaspoon / 5 ml every 4-6 hours as  needed for cough  You may also take acetaminophen (Tylenol) as needed for fever.   Reduce your risk of any infection by using the same precautions used for avoiding the common cold or flu:  . Wash your hands often with soap and warm water for at least 20 seconds.  If soap and water are not readily available, use an alcohol-based hand sanitizer with at least 60% alcohol.  . If coughing or sneezing, cover your mouth and nose by coughing or sneezing into the elbow areas of your shirt or coat, into a tissue or into your sleeve (not your hands). . Avoid shaking hands with others and consider head nods or verbal greetings only. . Avoid touching your eyes, nose, or mouth with unwashed hands.  . Avoid close contact with people who are sick. . Avoid places or events with large numbers of people in one location, like concerts or sporting events. . Carefully consider travel plans you have or are making. . If you are planning any travel outside or inside the [place], visit the CDC's Travelers' Health webpage for the latest health notices. . If you have some symptoms but not all symptoms, continue to monitor at home and seek medical attention if your symptoms worsen. . If you are having a medical emergency, call 911.  HOME CARE . Only take medications as instructed by your medical team. . Drink plenty of fluids and get plenty of rest. . A steam or ultrasonic humidifier can help if you   have congestion.   GET HELP RIGHT AWAY IF YOU HAVE EMERGENCY WARNING SIGNS** FOR COVID-19. If you or someone is showing any of these signs seek emergency medical care immediately. Call 911 or proceed to your closest emergency facility if: . You develop worsening high fever. . Trouble breathing . Bluish lips or face . Persistent pain or pressure in the chest . New confusion . Inability to wake or stay awake . You cough up blood. . Your symptoms become more severe  **This list is not all possible symptoms. Contact your  medical provider for any symptoms that are sever or concerning to you.   MAKE SURE YOU   Understand these instructions.  Will watch your condition.  Will get help right away if you are not doing well or get worse.  Your e-visit answers were reviewed by a board certified advanced clinical practitioner to complete your personal care plan.  Depending on the condition, your plan could have included both over the counter or prescription medications.  If there is a problem please reply once you have received a response from your provider.  Your safety is important to us.  If you have drug allergies check your prescription carefully.    You can use MyChart to ask questions about today's visit, request a non-urgent call back, or ask for a work or school excuse for 24 hours related to this e-Visit. If it has been greater than 24 hours you will need to follow up with your provider, or enter a new e-Visit to address those concerns. You will get an e-mail in the next two days asking about your experience.  I hope that your e-visit has been valuable and will speed your recovery. Thank you for using e-visits.   Greater than 5 minutes, yet less than 10 minutes of time have been spent researching, coordinating, and implementing care for this patient today 

## 2018-10-16 LAB — NOVEL CORONAVIRUS, NAA: SARS-CoV-2, NAA: NOT DETECTED

## 2019-08-10 ENCOUNTER — Other Ambulatory Visit: Payer: 59
